# Patient Record
Sex: Female | Born: 1945
Health system: Southern US, Community
[De-identification: ages and names within clinical notes are randomized; demographics above are authoritative.]

## PROBLEM LIST (undated history)

## (undated) DIAGNOSIS — Z86018 Personal history of other benign neoplasm: Secondary | ICD-10-CM

## (undated) DIAGNOSIS — T7840XA Allergy, unspecified, initial encounter: Secondary | ICD-10-CM

## (undated) DIAGNOSIS — F329 Major depressive disorder, single episode, unspecified: Secondary | ICD-10-CM

## (undated) DIAGNOSIS — N183 Chronic kidney disease, stage 3 unspecified: Secondary | ICD-10-CM

## (undated) DIAGNOSIS — M1990 Unspecified osteoarthritis, unspecified site: Secondary | ICD-10-CM

## (undated) DIAGNOSIS — F32A Depression, unspecified: Secondary | ICD-10-CM

## (undated) DIAGNOSIS — H269 Unspecified cataract: Secondary | ICD-10-CM

## (undated) DIAGNOSIS — M159 Polyosteoarthritis, unspecified: Secondary | ICD-10-CM

## (undated) DIAGNOSIS — K219 Gastro-esophageal reflux disease without esophagitis: Secondary | ICD-10-CM

## (undated) DIAGNOSIS — E785 Hyperlipidemia, unspecified: Secondary | ICD-10-CM

## (undated) DIAGNOSIS — G4733 Obstructive sleep apnea (adult) (pediatric): Secondary | ICD-10-CM

## (undated) DIAGNOSIS — M8580 Other specified disorders of bone density and structure, unspecified site: Secondary | ICD-10-CM

## (undated) DIAGNOSIS — J309 Allergic rhinitis, unspecified: Secondary | ICD-10-CM

## (undated) DIAGNOSIS — F419 Anxiety disorder, unspecified: Secondary | ICD-10-CM

## (undated) DIAGNOSIS — K760 Fatty (change of) liver, not elsewhere classified: Secondary | ICD-10-CM

## (undated) DIAGNOSIS — G4730 Sleep apnea, unspecified: Secondary | ICD-10-CM

## (undated) DIAGNOSIS — I10 Essential (primary) hypertension: Secondary | ICD-10-CM

## (undated) HISTORY — DX: Unspecified cataract: H26.9

## (undated) HISTORY — DX: Obstructive sleep apnea (adult) (pediatric): G47.33

## (undated) HISTORY — DX: Other specified disorders of bone density and structure, unspecified site: M85.80

## (undated) HISTORY — PX: TOTAL ABDOMINAL HYSTERECTOMY W/ BILATERAL SALPINGOOPHORECTOMY: SHX83

## (undated) HISTORY — DX: Chronic kidney disease, stage 3 (moderate): N18.3

## (undated) HISTORY — DX: Essential (primary) hypertension: I10

## (undated) HISTORY — DX: Chronic kidney disease, stage 3 unspecified: N18.30

## (undated) HISTORY — DX: Polyosteoarthritis, unspecified: M15.9

## (undated) HISTORY — DX: Unspecified osteoarthritis, unspecified site: M19.90

## (undated) HISTORY — DX: Fatty (change of) liver, not elsewhere classified: K76.0

## (undated) HISTORY — DX: Major depressive disorder, single episode, unspecified: F32.9

## (undated) HISTORY — DX: Sleep apnea, unspecified: G47.30

## (undated) HISTORY — DX: Anxiety disorder, unspecified: F41.9

## (undated) HISTORY — DX: Hyperlipidemia, unspecified: E78.5

## (undated) HISTORY — DX: Allergy, unspecified, initial encounter: T78.40XA

## (undated) HISTORY — DX: Personal history of other benign neoplasm: Z86.018

## (undated) HISTORY — DX: Depression, unspecified: F32.A

## (undated) HISTORY — DX: Allergic rhinitis, unspecified: J30.9

## (undated) HISTORY — DX: Gastro-esophageal reflux disease without esophagitis: K21.9

## (undated) HISTORY — PX: BREAST EXCISIONAL BIOPSY: SUR124

## (undated) HISTORY — PX: COLONOSCOPY: SHX174

---

## 1991-02-10 DIAGNOSIS — Z86018 Personal history of other benign neoplasm: Secondary | ICD-10-CM

## 1991-02-10 HISTORY — PX: ACOUSTIC NEUROMA RESECTION: SHX5713

## 1991-02-10 HISTORY — DX: Personal history of other benign neoplasm: Z86.018

## 1997-06-01 ENCOUNTER — Ambulatory Visit (HOSPITAL_BASED_OUTPATIENT_CLINIC_OR_DEPARTMENT_OTHER): Admission: RE | Admit: 1997-06-01 | Discharge: 1997-06-01 | Payer: Self-pay | Admitting: *Deleted

## 1998-11-16 ENCOUNTER — Emergency Department (HOSPITAL_COMMUNITY): Admission: EM | Admit: 1998-11-16 | Discharge: 1998-11-16 | Payer: Self-pay | Admitting: Emergency Medicine

## 1999-08-29 ENCOUNTER — Encounter: Payer: Self-pay | Admitting: Otolaryngology

## 1999-08-29 ENCOUNTER — Ambulatory Visit (HOSPITAL_COMMUNITY): Admission: RE | Admit: 1999-08-29 | Discharge: 1999-08-29 | Payer: Self-pay | Admitting: Otolaryngology

## 2000-06-16 ENCOUNTER — Encounter: Payer: Self-pay | Admitting: Otolaryngology

## 2000-06-16 ENCOUNTER — Encounter: Admission: RE | Admit: 2000-06-16 | Discharge: 2000-06-16 | Payer: Self-pay | Admitting: Otolaryngology

## 2004-03-12 ENCOUNTER — Ambulatory Visit: Payer: Self-pay | Admitting: Family Medicine

## 2004-05-20 ENCOUNTER — Ambulatory Visit (HOSPITAL_COMMUNITY): Admission: RE | Admit: 2004-05-20 | Discharge: 2004-05-20 | Payer: Self-pay | Admitting: Otolaryngology

## 2005-10-13 ENCOUNTER — Ambulatory Visit: Payer: Self-pay | Admitting: Family Medicine

## 2005-10-20 ENCOUNTER — Ambulatory Visit: Payer: Self-pay | Admitting: Family Medicine

## 2005-12-23 ENCOUNTER — Ambulatory Visit: Payer: Self-pay | Admitting: Family Medicine

## 2005-12-23 LAB — CONVERTED CEMR LAB
AST: 26 units/L (ref 0–37)
Albumin: 4.3 g/dL (ref 3.5–5.2)
Alkaline Phosphatase: 67 units/L (ref 39–117)
Total Bilirubin: 1 mg/dL (ref 0.3–1.2)

## 2006-09-17 DIAGNOSIS — J309 Allergic rhinitis, unspecified: Secondary | ICD-10-CM

## 2006-09-17 DIAGNOSIS — I10 Essential (primary) hypertension: Secondary | ICD-10-CM | POA: Insufficient documentation

## 2006-09-17 DIAGNOSIS — K219 Gastro-esophageal reflux disease without esophagitis: Secondary | ICD-10-CM

## 2006-10-20 ENCOUNTER — Encounter: Payer: Self-pay | Admitting: Family Medicine

## 2007-01-10 ENCOUNTER — Ambulatory Visit: Payer: Self-pay | Admitting: Family Medicine

## 2007-01-10 LAB — CONVERTED CEMR LAB
ALT: 32 units/L (ref 0–35)
Alkaline Phosphatase: 62 units/L (ref 39–117)
Basophils Relative: 0.8 % (ref 0.0–1.0)
Bilirubin, Direct: 0.1 mg/dL (ref 0.0–0.3)
CO2: 28 meq/L (ref 19–32)
Calcium: 9.1 mg/dL (ref 8.4–10.5)
Creatinine, Ser: 0.7 mg/dL (ref 0.4–1.2)
Eosinophils Relative: 2.2 % (ref 0.0–5.0)
GFR calc Af Amer: 110 mL/min
Glucose, Bld: 92 mg/dL (ref 70–99)
Hemoglobin: 14 g/dL (ref 12.0–15.0)
Lymphocytes Relative: 34.2 % (ref 12.0–46.0)
Monocytes Absolute: 0.4 10*3/uL (ref 0.2–0.7)
Neutro Abs: 3.5 10*3/uL (ref 1.4–7.7)
Platelets: 258 10*3/uL (ref 150–400)
RDW: 11.8 % (ref 11.5–14.6)
Total Bilirubin: 0.5 mg/dL (ref 0.3–1.2)
Total CHOL/HDL Ratio: 3.7
Total Protein: 6.7 g/dL (ref 6.0–8.3)
VLDL: 19 mg/dL (ref 0–40)
WBC: 6.3 10*3/uL (ref 4.5–10.5)

## 2007-01-17 ENCOUNTER — Ambulatory Visit: Payer: Self-pay | Admitting: Family Medicine

## 2007-06-08 ENCOUNTER — Ambulatory Visit: Payer: Self-pay | Admitting: Internal Medicine

## 2007-06-08 ENCOUNTER — Encounter: Payer: Self-pay | Admitting: Family Medicine

## 2007-06-28 ENCOUNTER — Telehealth: Payer: Self-pay | Admitting: *Deleted

## 2007-07-18 ENCOUNTER — Telehealth: Payer: Self-pay | Admitting: *Deleted

## 2007-12-06 ENCOUNTER — Encounter: Payer: Self-pay | Admitting: Family Medicine

## 2008-01-11 ENCOUNTER — Ambulatory Visit: Payer: Self-pay | Admitting: Family Medicine

## 2008-01-11 DIAGNOSIS — N9089 Other specified noninflammatory disorders of vulva and perineum: Secondary | ICD-10-CM

## 2008-01-11 LAB — CONVERTED CEMR LAB
ALT: 41 units/L — ABNORMAL HIGH (ref 0–35)
AST: 34 units/L (ref 0–37)
Basophils Absolute: 0 10*3/uL (ref 0.0–0.1)
Basophils Relative: 0.8 % (ref 0.0–3.0)
Bilirubin, Direct: 0.1 mg/dL (ref 0.0–0.3)
Blood in Urine, dipstick: NEGATIVE
CO2: 30 meq/L (ref 19–32)
Calcium: 9.7 mg/dL (ref 8.4–10.5)
Chloride: 106 meq/L (ref 96–112)
Creatinine, Ser: 0.7 mg/dL (ref 0.4–1.2)
Direct LDL: 188.6 mg/dL
Eosinophils Relative: 5.4 % — ABNORMAL HIGH (ref 0.0–5.0)
Glucose, Bld: 94 mg/dL (ref 70–99)
Glucose, Urine, Semiquant: NEGATIVE
HDL: 73 mg/dL (ref >39.0)
Hemoglobin: 13.8 g/dL (ref 12.0–15.0)
Lymphocytes Relative: 32.5 % (ref 12.0–46.0)
MCHC: 34.5 g/dL (ref 30.0–36.0)
Monocytes Relative: 7.2 % (ref 3.0–12.0)
Neutro Abs: 3.3 10*3/uL (ref 1.4–7.7)
Neutrophils Relative %: 54.1 % (ref 43.0–77.0)
Nitrite: NEGATIVE
Protein, U semiquant: NEGATIVE
RBC: 4.34 M/uL (ref 3.87–5.11)
Total Bilirubin: 0.6 mg/dL (ref 0.3–1.2)
Total CHOL/HDL Ratio: 3.7
Total Protein: 7.1 g/dL (ref 6.0–8.3)
Urobilinogen, UA: 0.2
VLDL: 17 mg/dL (ref 0–40)
WBC Urine, dipstick: NEGATIVE
WBC: 5.9 10*3/uL (ref 4.5–10.5)
pH: 6

## 2008-01-26 ENCOUNTER — Ambulatory Visit: Payer: Self-pay | Admitting: Family Medicine

## 2008-03-22 ENCOUNTER — Ambulatory Visit: Payer: Self-pay | Admitting: Family Medicine

## 2008-03-22 LAB — CONVERTED CEMR LAB
Direct LDL: 128 mg/dL
HDL: 62.9 mg/dL (ref >39.0)
VLDL: 21 mg/dL (ref 0–40)

## 2008-03-29 ENCOUNTER — Ambulatory Visit: Payer: Self-pay | Admitting: Family Medicine

## 2008-04-04 LAB — CONVERTED CEMR LAB
Bilirubin, Direct: 0.1 mg/dL (ref 0.0–0.3)
Total Bilirubin: 0.7 mg/dL (ref 0.3–1.2)

## 2008-12-31 ENCOUNTER — Encounter: Admission: RE | Admit: 2008-12-31 | Discharge: 2008-12-31 | Payer: Self-pay | Admitting: Obstetrics and Gynecology

## 2009-03-07 ENCOUNTER — Ambulatory Visit: Payer: Self-pay | Admitting: Family Medicine

## 2009-03-07 LAB — CONVERTED CEMR LAB
ALT: 49 units/L — ABNORMAL HIGH (ref 0–35)
BUN: 16 mg/dL (ref 6–23)
Bilirubin Urine: NEGATIVE
Bilirubin, Direct: 0.1 mg/dL (ref 0.0–0.3)
Blood in Urine, dipstick: NEGATIVE
Calcium: 10 mg/dL (ref 8.4–10.5)
Cholesterol: 210 mg/dL — ABNORMAL HIGH (ref 0–200)
Creatinine, Ser: 1 mg/dL (ref 0.4–1.2)
Direct LDL: 128.2 mg/dL
Eosinophils Relative: 3.9 % (ref 0.0–5.0)
GFR calc non Af Amer: 59.67 mL/min (ref >60)
Glucose, Urine, Semiquant: NEGATIVE
HDL: 61.5 mg/dL (ref >39.00)
Lymphocytes Relative: 30.6 % (ref 12.0–46.0)
MCV: 92.1 fL (ref 78.0–100.0)
Monocytes Absolute: 0.4 10*3/uL (ref 0.1–1.0)
Monocytes Relative: 8.1 % (ref 3.0–12.0)
Neutrophils Relative %: 56.7 % (ref 43.0–77.0)
Platelets: 244 10*3/uL (ref 150.0–400.0)
Total Bilirubin: 0.9 mg/dL (ref 0.3–1.2)
Triglycerides: 152 mg/dL — ABNORMAL HIGH (ref 0.0–149.0)
Urobilinogen, UA: 0.2
VLDL: 30.4 mg/dL (ref 0.0–40.0)
WBC: 5.4 10*3/uL (ref 4.5–10.5)
pH: 5.5

## 2009-03-14 ENCOUNTER — Encounter (INDEPENDENT_AMBULATORY_CARE_PROVIDER_SITE_OTHER): Payer: Self-pay | Admitting: *Deleted

## 2009-03-14 ENCOUNTER — Ambulatory Visit: Payer: Self-pay | Admitting: Family Medicine

## 2009-03-14 DIAGNOSIS — R945 Abnormal results of liver function studies: Secondary | ICD-10-CM | POA: Insufficient documentation

## 2009-03-14 DIAGNOSIS — G4720 Circadian rhythm sleep disorder, unspecified type: Secondary | ICD-10-CM

## 2009-04-05 ENCOUNTER — Ambulatory Visit: Payer: Self-pay | Admitting: Gastroenterology

## 2009-04-05 LAB — CONVERTED CEMR LAB
Ferritin: 250 ng/mL (ref 10.0–291.0)
Hep B S Ab: NEGATIVE
Iron: 66 ug/dL (ref 42–145)
Saturation Ratios: 17.6 % — ABNORMAL LOW (ref 20.0–50.0)

## 2009-04-11 ENCOUNTER — Ambulatory Visit (HOSPITAL_COMMUNITY): Admission: RE | Admit: 2009-04-11 | Discharge: 2009-04-11 | Payer: Self-pay | Admitting: Gastroenterology

## 2009-04-11 ENCOUNTER — Ambulatory Visit: Payer: Self-pay | Admitting: Pulmonary Disease

## 2009-04-11 DIAGNOSIS — G4710 Hypersomnia, unspecified: Secondary | ICD-10-CM

## 2009-04-12 ENCOUNTER — Telehealth: Payer: Self-pay | Admitting: Gastroenterology

## 2009-04-23 LAB — HM PAP SMEAR: HM Pap smear: NORMAL

## 2009-04-24 DIAGNOSIS — G4700 Insomnia, unspecified: Secondary | ICD-10-CM

## 2009-05-10 ENCOUNTER — Encounter: Payer: Self-pay | Admitting: Pulmonary Disease

## 2009-05-10 ENCOUNTER — Ambulatory Visit (HOSPITAL_BASED_OUTPATIENT_CLINIC_OR_DEPARTMENT_OTHER): Admission: RE | Admit: 2009-05-10 | Discharge: 2009-05-10 | Payer: Self-pay | Admitting: Pulmonary Disease

## 2009-05-17 ENCOUNTER — Ambulatory Visit (HOSPITAL_COMMUNITY): Admission: RE | Admit: 2009-05-17 | Discharge: 2009-05-17 | Payer: Self-pay | Admitting: Otolaryngology

## 2009-05-28 ENCOUNTER — Telehealth (INDEPENDENT_AMBULATORY_CARE_PROVIDER_SITE_OTHER): Payer: Self-pay | Admitting: *Deleted

## 2009-06-03 ENCOUNTER — Ambulatory Visit: Payer: Self-pay | Admitting: Pulmonary Disease

## 2009-06-03 ENCOUNTER — Encounter (INDEPENDENT_AMBULATORY_CARE_PROVIDER_SITE_OTHER): Payer: Self-pay | Admitting: *Deleted

## 2009-06-03 DIAGNOSIS — G4733 Obstructive sleep apnea (adult) (pediatric): Secondary | ICD-10-CM | POA: Insufficient documentation

## 2009-06-05 ENCOUNTER — Ambulatory Visit: Payer: Self-pay | Admitting: Gastroenterology

## 2009-06-08 ENCOUNTER — Ambulatory Visit: Payer: Self-pay | Admitting: Pulmonary Disease

## 2009-06-18 ENCOUNTER — Ambulatory Visit: Payer: Self-pay | Admitting: Gastroenterology

## 2009-06-27 ENCOUNTER — Telehealth (INDEPENDENT_AMBULATORY_CARE_PROVIDER_SITE_OTHER): Payer: Self-pay | Admitting: *Deleted

## 2009-07-24 ENCOUNTER — Ambulatory Visit: Payer: Self-pay | Admitting: Pulmonary Disease

## 2009-08-29 ENCOUNTER — Telehealth (INDEPENDENT_AMBULATORY_CARE_PROVIDER_SITE_OTHER): Payer: Self-pay | Admitting: *Deleted

## 2009-08-30 ENCOUNTER — Encounter: Payer: Self-pay | Admitting: Pulmonary Disease

## 2010-02-09 DIAGNOSIS — K760 Fatty (change of) liver, not elsewhere classified: Secondary | ICD-10-CM

## 2010-02-09 DIAGNOSIS — E785 Hyperlipidemia, unspecified: Secondary | ICD-10-CM

## 2010-02-09 HISTORY — DX: Fatty (change of) liver, not elsewhere classified: K76.0

## 2010-02-09 HISTORY — DX: Hyperlipidemia, unspecified: E78.5

## 2010-02-13 ENCOUNTER — Encounter
Admission: RE | Admit: 2010-02-13 | Discharge: 2010-02-13 | Payer: Self-pay | Source: Home / Self Care | Attending: Obstetrics and Gynecology | Admitting: Obstetrics and Gynecology

## 2010-03-04 ENCOUNTER — Ambulatory Visit
Admission: RE | Admit: 2010-03-04 | Discharge: 2010-03-04 | Payer: Self-pay | Source: Home / Self Care | Attending: Family Medicine | Admitting: Family Medicine

## 2010-03-04 ENCOUNTER — Encounter: Payer: Self-pay | Admitting: Family Medicine

## 2010-03-11 NOTE — Assessment & Plan Note (Signed)
Summary: ABNORMAL LIVER FUNCTIONS/YF   History of Present Illness Visit Type: Initial Consult Primary GI MD: Melvia Heaps MD [facility] Primary Provider: Kelle Darting, MD Requesting Provider: Kelle Darting, MD Chief Complaint: Patient referred for her abnormal LFTS and her sister was recently dxed with liver disease. She denies any current GI compaints.  History of Present Illness:   Joan Mahoney is a pleasant 65 year old white female referred at the request of Dr. Tawanna Cooler for evaluation of abnormal liver tests.  These were noted on incidental testing.  She has no complaints including pain, pruritus or jaundice.  There is no history of IV drug use, blood transfusions, hepatitis or jaundice.  Family history is pertinent for a sister who has nonalcoholic cirrhosis and a uncle who had nonalcoholic cirrhosis.  Blood work in January, 2011 was pertinent for an AST of 38 and ALT of 49; other liver tests were normal.   GI Review of Systems      Denies abdominal pain, acid reflux, belching, bloating, chest pain, dysphagia with liquids, dysphagia with solids, heartburn, loss of appetite, nausea, vomiting, vomiting blood, weight loss, and  weight gain.        Denies anal fissure, black tarry stools, change in bowel habit, constipation, diarrhea, diverticulosis, fecal incontinence, heme positive stool, hemorrhoids, irritable bowel syndrome, jaundice, light color stool, liver problems, rectal bleeding, and  rectal pain. Preventive Screening-Counseling & Management      Drug Use:  no.      Current Medications (verified): 1)  Prilosec Otc 20 Mg  Tbec (Omeprazole Magnesium) .... Take 1 Tablet By Mouth Once A Day 2)  Aspirin 81 Mg  Tabs (Aspirin) .... Sometimes 3)  Dyazide 37.5-25 Mg Caps (Triamterene-Hctz) .... Take 1 Tablet By Mouth Every Morning 4)  Alprazolam 0.25 Mg Tabs (Alprazolam) .... Take One By Mouth As Needed  Allergies (verified): No Known Drug Allergies  Past History:  Past Medical  History: Last updated: 01/26/2008 Allergic rhinitis GERD Insomnia Deaf left Ear secondary acoustic neuroma Hypertension TAH/BSO.  No cancer BTL childbirth x 2 foot surgery outpatient Hyperlipidemia  Past Surgical History: Last updated: 09/17/2006 TAh/BSO BTL Foot sx Acustic Neuroma-Left  Family History: Family History Diabetes 1st degree relative: sister, father, mother Family History Hypertension:father Fam hx Non Hodgkin Lyphoma:mother Family History of Liver Disease/Cirrhosis: sister, maternal uncle both nonalcohol Family History of Colon Polyps:father No FH of Colon Cancer:  Social History: Former Smoker Married with two sons Alcohol Use - no Daily Caffeine Use 2 per day Illicit Drug Use - no Drug Use:  no  Review of Systems  The patient denies allergy/sinus, anemia, anxiety-new, arthritis/joint pain, back pain, blood in urine, breast changes/lumps, change in vision, confusion, cough, coughing up blood, depression-new, fainting, fatigue, fever, headaches-new, hearing problems, heart murmur, heart rhythm changes, itching, menstrual pain, muscle pains/cramps, night sweats, nosebleeds, pregnancy symptoms, shortness of breath, skin rash, sleeping problems, sore throat, swelling of feet/legs, swollen lymph glands, thirst - excessive , urination - excessive , urination changes/pain, urine leakage, vision changes, and voice change.    Vital Signs:  Patient profile:   65 year old female Height:      62 inches Weight:      170.2 pounds BMI:     31.24 Pulse rate:   84 / minute Pulse rhythm:   regular BP sitting:   138 / 78  (left arm) Cuff size:   regular  Vitals Entered By: Harlow Mares CMA Duncan Dull) (April 05, 2009 4:14 PM)  Physical Exam  Additional Exam:  On physical exam she is a well-developed large female  skin: anicteric HEENT: normocephalic; PEERLA; no nasal or pharyngeal abnormalities neck: supple nodes: no cervical lymphadenopathy chest: clear to  ausculatation and percussion heart: no murmurs, gallops, or rubs abd: soft, nontender; BS normoactive; no abdominal masses, tenderness, organomegaly rectal: deferred ext: no cynanosis, clubbing, edema skeletal: no deformities neuro: oriented x 3; no focal abnormalities    Impression & Recommendations:  Problem # 1:  NONSPECIFIC ABNORMAL RESULTS LIVR FUNCTION STUDY (ICD-794.8) Patient's  LFT abnormalities are probably due to hepatic steatosis.  Underlying liver disease including congenital liver disease is unlikely.  Recommendations #1 abdominal ultrasound #2 check serologies for hepatitis B and C., iron studies, ANA and AMA Orders: T-ANA ([id number]) T-AMA ([id number]) T-Hepatitis B Surface Antibody (30160-10932) T-Hepatitis B Surface Antigen (35573-22025) T-Hepatitis C Anti HCV (42706) Ultrasound Abdomen (UAS) TLB-IBC Pnl (Iron/FE;Transferrin) (83550-IBC) TLB-Iron, (Fe) Total (83540-FE) TLB-Ferritin (82728-FER) TLB-PTT (85730-PTTL)  Patient Instructions: 1)  Please schedule a follow-up appointment in 3 weeks.  2)  The medication list was reviewed and reconciled.  All changed / newly prescribed medications were explained.  A complete medication list was provided to the patient / caregiver.

## 2010-03-11 NOTE — Progress Notes (Signed)
Summary: U/S results and ? colonoscopy  Phone Note Outgoing Call Call back at [facility] Phone [phone]   Call placed by: Merri Ray CMA Duncan Dull),  April 12, 2009 8:11 AM Summary of Call: Called pt to inform that her test results from her U/S are negative. L/M for pt to return call. Initial call taken by: Merri Ray CMA Duncan Dull),  April 12, 2009 8:12 AM  Follow-up for Phone Call        Pt returned call, explained to her that her U/S results are normal. She hasnt had a colonoscopy since 2000, the records are not available, Wonders if she needs to schedule.    Dr Arlyce Dice Please advise Follow-up by: Merri Ray CMA Duncan Dull),  April 12, 2009 8:41 AM  Additional Follow-up for Phone Call Additional follow up Details #1::        yes for colo Additional Follow-up by: Louis Meckel MD,  April 12, 2009 10:40 AM    Additional Follow-up for Phone Call Additional follow up Details #2::    Called pt, L/M for pt to return call Follow-up by: Merri Ray CMA Duncan Dull),  April 12, 2009 10:53 AM  Additional Follow-up for Phone Call Additional follow up Details #3:: Details for Additional Follow-up Action Taken: Called pt again. L/M for pt that she could contact the office and schedule her colonoscopy and previsit. Told pt to call back as needed  Additional Follow-up by: Merri Ray CMA Duncan Dull),  April 12, 2009 3:02 PM

## 2010-03-11 NOTE — Letter (Signed)
Summary: New Patient letter  [facility]  [address] [place], [place] [address]   Phone: [phone]  Fax: [phone]       03/14/2009 MRN: [id number]  Joan Mahoney [address] [place], [place]  [address]  Dear Ms. Iglesias,  Welcome to the Gastroenterology Division at [facility].    You are scheduled to see Dr.  Arlyce Dice on 04-05-09 at 3:45pm on the 3rd floor at [facility], [address]  We ask that you try to arrive at our office 15 minutes prior to your appointment time to allow for check-in.  We would like you to complete the enclosed self-administered evaluation form prior to your visit and bring it with you on the day of your appointment.  We will review it with you.  Also, please bring a complete list of all your medications or, if you prefer, bring the medication bottles and we will list them.  Please bring your insurance card so that we may make a copy of it.  If your insurance requires a referral to see a specialist, please bring your referral form from your primary care physician.  Co-payments are due at the time of your visit and may be paid by cash, check or credit card.     Your office visit will consist of a consult with your physician (includes a physical exam), any laboratory testing he/she may order, scheduling of any necessary diagnostic testing (e.g. x-ray, ultrasound, CT-scan), and scheduling of a procedure (e.g. Endoscopy, Colonoscopy) if required.  Please allow enough time on your schedule to allow for any/all of these possibilities.    If you cannot keep your appointment, please call [phone] to cancel or reschedule prior to your appointment date.  This allows [place] the opportunity to schedule an appointment for another patient in need of care.  If you do not cancel or reschedule by 5 p.m. the business day prior to your appointment date, you will be charged a $50.00 late cancellation/no-show fee.    Thank you for choosing [facility] for your medical needs.  We appreciate the opportunity to care for you.  Please visit [place] at our website  to learn more about our practice.                     Sincerely,                                                             The Gastroenterology Division

## 2010-03-11 NOTE — Progress Notes (Signed)
Summary: sinus infection  Phone Note Call from Patient Call back at [phone]   Caller: Patient Call For: clance Summary of Call: pt think c pap machine is causing sinus infection. she is going out of town and would like to know if she can d/c use of it until she return from trip. Initial call taken by: Rickard Patience,  Jun 27, 2009 9:28 AM  Follow-up for Phone Call        The patient c/o sinus pain and pressure for several days Runny nose with yellow drainage and a sore throat. She believes the cpap is contributing to this ptoblem. She is doing daily nasal irrigation. She is flying OOT on Sunday and would like to know if she can stop using the CPAP until she gets back in 1 week. Please advise on sinus pressure and cpap use.  Follow-up by: Michel Bickers CMA,  Jun 27, 2009 9:55 AM  Additional Follow-up for Phone Call Additional follow up Details #1::        typically, cpap does not cause sinus issues if they are kept clean, and if the humidity is kept at an adequate level.  I would wonder if she needs to turn the heat up to get more humidity.  It is ok for her to vacation without her cpap, understanding that her sleep may not be very good.  Can use sinus rinses and tylenol cold and sinus for 5 days or less to see if  improves. Additional Follow-up by: Barbaraann Share MD,  Jun 27, 2009 4:22 PM    Additional Follow-up for Phone Call Additional follow up Details #2::    LMTCB at home # and work number. Carron Curie CMA  Jun 27, 2009 4:25 PM  The pt is aware to use sinus rinses and tylenol cold and sinus for 5 days or less to see if this helps with the sinus pressure. She is also aware she can travel without the cpap but will need to start using again as soon as she returns home and may want to try turning up the humidity to see if this also helps. She will call if she continues to have any problems.  Follow-up by: Michel Bickers CMA,  Jun 27, 2009 4:32 PM

## 2010-03-11 NOTE — Miscellaneous (Signed)
  Clinical Lists Changes  Orders: Added new Referral order of DME Referral (DME) - Signed

## 2010-03-11 NOTE — Assessment & Plan Note (Signed)
Summary: consult for hypersomnia, insomnia   Copy to:  Kelle Darting, MD Primary Provider/Referring Provider:  Kelle Darting, MD  CC:  Sleep Consult.  History of Present Illness: The pt is a 65y/o female who I have been asked to see for possible osa.  She has had sleeping issues for many years, and has also been noted to have loud snoring but no observed pauses in breathing.  She typically goes to bed btw 10-11:30pm, and awakens at 6am to start her day.  She never feels rested upon arising.  She will get to sleep in less than 4/7 nights, but may take an hour the other 3.  She states that her mind is "racing".  She typically will stay in bed and toss and turn.  Part of the issue is that her spouse snores.  She denies any significant RLS symptoms in the evening, and her husband has not commented on kicking during the night.  She does have some chronic pain, but does not believe this hinders her sleep.  She does note some sleep pressure at work which is especially an issue in the afternoons.  She will typically drink 2 cups of coffee in the am, and one around 3pm.  She does note sleep pressure watching tv in the evening, and will take catnaps on occasion in the early evening during the week.  Her weight is neutral over the past 2 years, and her epworth score today is 14.  Medications Prior to Update: 1)  Prilosec Otc 20 Mg  Tbec (Omeprazole Magnesium) .... Take 1 Tablet By Mouth Once A Day 2)  Aspirin 81 Mg  Tabs (Aspirin) .... Sometimes 3)  Dyazide 37.5-25 Mg Caps (Triamterene-Hctz) .... Take 1 Tablet By Mouth Every Morning 4)  Alprazolam 0.25 Mg Tabs (Alprazolam) .... Take One By Mouth As Needed  Current Medications (verified): 1)  Prilosec Otc 20 Mg  Tbec (Omeprazole Magnesium) .... Take 1 Tablet By Mouth Once A Day 2)  Aspirin 81 Mg  Tabs (Aspirin) .... Sometimes 3)  Dyazide 37.5-25 Mg Caps (Triamterene-Hctz) .... Take 1 Tablet By Mouth Every Morning 4)  Alprazolam 0.25 Mg Tabs (Alprazolam)  .... Take One By Mouth As Needed 5)  Allergy Injections .... Take As Directed  Allergies (verified): No Known Drug Allergies  Past History:  Past Medical History: Reviewed history from 01/26/2008 and no changes required. Allergic rhinitis GERD Insomnia Deaf left Ear secondary acoustic neuroma Hypertension TAH/BSO.  No cancer BTL childbirth x 2 foot surgery outpatient Hyperlipidemia  Past Surgical History: TAh/BSO  1997 BTL 1989 Foot sx Acustic Neuroma-Left breast biopsy 1985  Family History: Reviewed history from 04/05/2009 and no changes required. Family History Diabetes 1st degree relative: sister, father, mother Family History Hypertension:father Fam hx Non Hodgkin Lyphoma:mother Family History of Liver Disease/Cirrhosis: sister, maternal uncle both nonalcohol Family History of Colon Polyps:father No FH of Colon Cancer: allergies: father, sons  Social History: Reviewed history from 04/05/2009 and no changes required. Former Smoker.  started as a "teenager"  smoked "socially"  Quit 2010. Married with two sons pt works as a Counsellor Alcohol Use - no Daily Caffeine Use 2 per day Illicit Drug Use - no  Review of Systems       The patient complains of acid heartburn, indigestion, difficulty swallowing, sore throat, headaches, nasal congestion/difficulty breathing through nose, sneezing, ear ache, depression, and hand/feet swelling.  The patient denies shortness of breath with activity, shortness of breath at rest, productive cough, non-productive cough, coughing  up blood, chest pain, irregular heartbeats, loss of appetite, weight change, abdominal pain, tooth/dental problems, itching, anxiety, joint stiffness or pain, rash, change in color of mucus, and fever.    Vital Signs:  Patient profile:   65 year old female Height:      62 inches Weight:      173 pounds BMI:     31.76 O2 Sat:      97 % on Room air Temp:     98.4 degrees F oral Pulse rate:   100 /  minute BP sitting:   142 / 94  (left arm) Cuff size:   regular  Vitals Entered By: Arman Filter LPN (April 11, 1608 10:59 AM)  O2 Flow:  Room air CC: Sleep Consult Comments Medications reviewed with patient Arman Filter LPN  April 12, 9602 10:59 AM    Physical Exam  General:  ow female in nad Eyes:  PERRLA and EOMI.   Nose:  mildly deviated septum Mouth:  mild elongation of soft palate and uvula Neck:  large size, no jvd, tmg, LN Lungs:  clear to auscultation Heart:  rrr, no mrg Abdomen:  soft and nontender, bs+ Extremities:  no edema or cyanosis pulses intact distally Neurologic:  alert and oriented, moves all 4.   Impression & Recommendations:  Problem # 1:  HYPERSOMNIA (ICD-780.54) the pt clearly has daytime hypersomnia that can interfere with her QOL.  It is unclear if this is related to environmental issues, her insomnia, or possibly due to sleep apnea.  She is overweight and has loud snoring, and describes nonrestorative sleep even when she falls asleep quickly 4 nights out of 7.  She has no hx to support RLS.  At this point, would like to schedule for sleep study to evaluate for sleep apnea or any other disrupters of her sleep.   Problem # 2:  PERSISTENT DISORDER INITIATING/MAINTAINING SLEEP (ICD-307.42) the pt has some degree of psychophysiologic insomnia that hopefully can be resolved with behavioral techniques and improved sleep hygiene.  I have reviewed stimulus control therapy with her, as well as what constitutes good sleep hygiene.  Medications Added to Medication List This Visit: 1)  Allergy Injections  .... Take as directed  Other Orders: Consultation Level IV (54098) Sleep Disorder Referral (Sleep Disorder)  Patient Instructions: 1)  do not catnap during day or in evening. 2)  stop caffeine intake after 10am until we can sort out your sleep issues. 3)  do not stay in bed more than if you cannot initiate sleep...leave bedroom and watch tv or read  until you can re-initiate sleep.  Do this as many times as needed until you fall asleep and stay asleep 4)  will schedule for sleep study.

## 2010-03-11 NOTE — Assessment & Plan Note (Signed)
Summary: rov to discuss sleep study results.   Copy to:  Kelle Darting, MD Primary Provider/Referring Provider:  Kelle Darting, MD  CC:  Pt is here for a f/u appt to discuss sleep study results.  .  History of Present Illness: the pt comes in today for f/u of her recent sleep study.  She was found to have moderate osa with AHI of 22/hr, and desat to 83%.  I have reviewed the results with her in detail, and have answered all of her questions.  Current Medications (verified): 1)  Prilosec Otc 20 Mg  Tbec (Omeprazole Magnesium) .... Take 1 Tablet By Mouth Once A Day As Needed 2)  Aspirin 81 Mg  Tabs (Aspirin) .... Take 1 Tablet By Mouth Once A Day 3)  Dyazide 37.5-25 Mg Caps (Triamterene-Hctz) .... Take 1 Tablet By Mouth Every Morning 4)  Alprazolam 0.25 Mg Tabs (Alprazolam) .... Take One By Mouth As Needed 5)  Allergy Injections .... Take As Directed 6)  Allegra ?mg .... Take 1 Tablet By Mouth Once A Day  Allergies (verified): No Known Drug Allergies  Vital Signs:  Patient profile:   65 year old female Height:      62 inches Weight:      171.25 pounds O2 Sat:      94 % on Room air Temp:     98.3 degrees F oral Pulse rate:   82 / minute BP sitting:   118 / 80  (left arm) Cuff size:   regular  Vitals Entered By: Arman Filter LPN (June 03, 2009 2:34 PM)  O2 Flow:  Room air CC: Pt is here for a f/u appt to discuss sleep study results.   Comments Medications reviewed with patient Arman Filter LPN  June 03, 2009 2:34 PM    Physical Exam  General:  ow female in nad   Impression & Recommendations:  Problem # 1:  OBSTRUCTIVE SLEEP APNEA (ICD-327.23) the pt was found to have moderate osa by her sleep study.  I have discussed the various treatment options with her, including weight loss, surgery, dental appliance, and cpap.  After a long discussion, she decided to try cpap while working on weight loss.  I support this decision.  Time spent with pt today was .  Problem #  2:  PERSISTENT DISORDER INITIATING/MAINTAINING SLEEP (ICD-307.42) the pt has been doing better with sleep onset by sticking to sleep hygiene regimen and with the various behavioral techniques we previously discussed.  Medications Added to Medication List This Visit: 1)  Prilosec Otc 20 Mg Tbec (Omeprazole magnesium) .... Take 1 tablet by mouth once a day as needed 2)  Aspirin 81 Mg Tabs (Aspirin) .... Take 1 tablet by mouth once a day 3)  Allegra ?mg  .... Take 1 tablet by mouth once a day  Other Orders: Est. Patient Level III (65784) DME Referral (DME)  Patient Instructions: 1)  will start on cpap as a trial..please call if tolerance issues 2)  work on weight loss 3)  followup with me in 5 weeks.

## 2010-03-11 NOTE — Letter (Signed)
Summary: [facility] Instructions  [facility]  [address] [place], [place] [address]   Phone: [phone]  Fax: [phone]       Joan Mahoney    1945-11-15    MRN: [id number]        Procedure Day /Date: Tuesday 06/18/09     Arrival Time: 7:30 a.m.     Procedure Time: 8:00 a.m.     Location of Procedure:                    _x_  [facility] (4th Floor)                        PREPARATION FOR COLONOSCOPY WITH MOVIPREP   Starting 5 days prior to your procedure  06/13/09 do not eat nuts, seeds, popcorn, corn, beans, peas,  salads, or any raw vegetables.  Do not take any fiber supplements (e.g. Metamucil, Citrucel, and Benefiber).  THE DAY BEFORE YOUR PROCEDURE         DATE:  06/17/09   DAY:  Monday  1.  Drink clear liquids the entire day-NO SOLID FOOD  2.  Do not drink anything colored red or purple.  Avoid juices with pulp.  No orange juice.  3.  Drink at least 64 oz. (8 glasses) of fluid/clear liquids during the day to prevent dehydration and help the prep work efficiently.  CLEAR LIQUIDS INCLUDE: Water Jello Ice Popsicles Tea (sugar ok, no milk/cream) Powdered fruit flavored drinks Coffee (sugar ok, no milk/cream) Gatorade Juice: apple, white grape, white cranberry  Lemonade Clear bullion, consomm, broth Carbonated beverages (any kind) Strained chicken noodle soup Hard Candy                             4.  In the morning, mix first dose of MoviPrep solution:    Empty 1 Pouch A and 1 Pouch B into the disposable container    Add lukewarm drinking water to the top line of the container. Mix to dissolve    Refrigerate (mixed solution should be used within 24 hrs)  5.  Begin drinking the prep at 5:00 p.m. The MoviPrep container is divided by 4 marks.   Every 15 minutes drink the solution down to the next mark (approximately 8 oz) until the full liter is complete.   6.  Follow completed prep with 16 oz of clear liquid of your choice  (Nothing red or purple).  Continue to drink clear liquids until bedtime.  7.  Before going to bed, mix second dose of MoviPrep solution:    Empty 1 Pouch A and 1 Pouch B into the disposable container    Add lukewarm drinking water to the top line of the container. Mix to dissolve    Refrigerate  THE DAY OF YOUR PROCEDURE      DATE:  06/18/09   DAY:  Tuesday  Beginning at  3:00 a.m. (5 hours before procedure):         1. Every 15 minutes, drink the solution down to the next mark (approx 8 oz) until the full liter is complete.  2. Follow completed prep with 16 oz. of clear liquid of your choice.    3. You may drink clear liquids until  6:00 a.m.  (64 HOURS BEFORE PROCEDURE).   MEDICATION INSTRUCTIONS  Unless otherwise instructed, you should take regular prescription medications with a small sip of  water   as early as possible the morning of your procedure.   Additional medication instructions: Hold dyazide the morning of procedure.         OTHER INSTRUCTIONS  You will need a responsible adult at least 64 years of age to accompany you and drive you home.   This person must remain in the waiting room during your procedure.  Wear loose fitting clothing that is easily removed.  Leave jewelry and other valuables at home.  However, you may wish to bring a book to read or  an iPod/MP3 player to listen to music as you wait for your procedure to start.  Remove all body piercing jewelry and leave at home.  Total time from sign-in until discharge is approximately 2-3 hours.  You should go home directly after your procedure and rest.  You can resume normal activities the  day after your procedure.  The day of your procedure you should not:   Drive   Make legal decisions   Operate machinery   Drink alcohol   Return to work  You will receive specific instructions about eating, activities and medications before you leave.    The above instructions have been reviewed and  explained to me by   Wyona Almas RN  June 05, 2009 11:28 AM     I fully understand and can verbalize these instructions _____________________________ Date _________

## 2010-03-11 NOTE — Procedures (Signed)
Summary: Colonoscopy  Patient: Joan Mahoney Note: All result statuses are Final unless otherwise noted.  Tests: (1) Colonoscopy (COL)   COL Colonoscopy           DONE     [facility]     [address]     [place], [place]  [address]           COLONOSCOPY PROCEDURE REPORT           PATIENT:  Joan Mahoney  MR#:  [id number]     BIRTHDATE:  Aug 18, 1945, 63 yrs. old  GENDER:  female           ENDOSCOPIST:  Barbette Hair. Arlyce Dice, MD     Referred by:           PROCEDURE DATE:  06/18/2009     PROCEDURE:  Diagnostic Colonoscopy     ASA CLASS:  Class II     INDICATIONS:  1) Routine Risk Screening           MEDICATIONS:   Fentanyl 75 mcg IV, Versed 10 mg IV           DESCRIPTION OF PROCEDURE:   After the risks benefits and     alternatives of the procedure were thoroughly explained, informed     consent was obtained.  Digital rectal exam was performed and     revealed no abnormalities.   The LB CF-[id number] endoscope     was introduced through the anus and advanced to the cecum, which     was identified by both the appendix and ileocecal valve, without     limitations.  The quality of the prep was excellent, using     MoviPrep.  The instrument was then slowly withdrawn as the colon     was fully examined.     <<PROCEDUREIMAGES>>           FINDINGS:  Mild diverticulosis was found in the sigmoid colon (see     image11).  This was otherwise a normal examination of the colon     (see image2, image3, image4, image5, image7, image8, image13, and     image14).   Retroflexed views in the rectum revealed no     abnormalities.    The time to cecum =  4.25  minutes. The scope     was then withdrawn (time =  6.25  min) from the patient and the     procedure completed.           COMPLICATIONS:  None           ENDOSCOPIC IMPRESSION:     1) Mild diverticulosis in the sigmoid colon     2) Otherwise normal examination     RECOMMENDATIONS:     1) Continue current colorectal  screening recommendations for     "routine risk" patients with a repeat colonoscopy in 10 years.           REPEAT EXAM:  In 10 year(s) for Colonoscopy.           ______________________________     Barbette Hair. Arlyce Dice, MD           CC: Roderick Pee, MD           n.     [name] at 06/18/2009 08:28 AM           [name], [name], [id number]  Note: An exclamation mark (!) indicates a result that was not  dispersed into the flowsheet. Document Creation Date: 06/18/2009 8:29 AM _______________________________________________________________________  (1) Order result status: Final Collection or observation date-time: 06/18/2009 08:24 Requested date-time:  Receipt date-time:  Reported date-time:  Referring Physician:   Ordering Physician: Melvia Heaps [phone]) Specimen Source:  Source: Launa Grill Order Number: [id number] Lab site:   Appended Document: Colonoscopy    Clinical Lists Changes  Observations: Added new observation of COLONNXTDUE: 06/2019 (06/18/2009 10:56)

## 2010-03-11 NOTE — Miscellaneous (Signed)
Summary: [facility] Previsit/prep  Clinical Lists Changes  Medications: Added new medication of MOVIPREP 100 GM  SOLR (PEG-KCL-NACL-NASULF-NA ASC-C) As per prep instructions. - Signed Rx of MOVIPREP 100 GM  SOLR (PEG-KCL-NACL-NASULF-NA ASC-C) As per prep instructions.;  #1 x 0;  Signed;  Entered by: Wyona Almas RN;  Authorized by: Louis Meckel MD;  Method used: Electronically to [facility] [address], [place], [place], [place]  [address], Ph: [phone], Fax: [phone] Observations: Added new observation of ALLERGY REV: Done (06/05/2009 10:47) Added new observation of NKA: T (06/05/2009 10:47)    Prescriptions: MOVIPREP 100 GM  SOLR (PEG-KCL-NACL-NASULF-NA ASC-C) As per prep instructions.  #1 x 0   Entered by:   Wyona Almas RN   Authorized by:   Louis Meckel MD   Signed by:   Wyona Almas RN on 06/05/2009   Method used:   Electronically to        [facility]  [address] (retail)       [address]       [place], [place]  [address]       Ph: [phone]       Fax: [phone]   RxID:   [id number]

## 2010-03-11 NOTE — Assessment & Plan Note (Signed)
Summary: rov for osa   Copy to:  Kelle Darting, MD Primary Provider/Referring Provider:  Kelle Darting, MD  CC:  Pt is here for a routine f/u appt on her OSA.   Pt states she is using her cpap machine every night except when she went on a recent vacaation and also having recent head congestion.  Pt states when she wears cpap machine she wears it approx 6 hours per night.  Pt c/o headaches and dry mouth.  Pt states she hasn't noticed change in sleep since being on cpap. Marland Kitchen  History of Present Illness: the pt comes in today for f/u of her osa.  She was started on cpap at the last visit, and has been compliant except for a few days while on vacation.  She is wearing 6hrs a night, and has not seen a big difference as of yet in her symptoms.  I have reminded her that we have yet to optimize her pressure.  She has no issues with her mask, but is having some headaches and dry mouth.  She is not adjusting her humidity level.  At least she has not encountered any problems that keep her from wearing.  Medications Prior to Update: 1)  Prilosec Otc 20 Mg  Tbec (Omeprazole Magnesium) .... Take 1 Tablet By Mouth Once A Day As Needed 2)  Aspirin 81 Mg  Tabs (Aspirin) .... Take 1 Tablet By Mouth Once A Day 3)  Dyazide 37.5-25 Mg Caps (Triamterene-Hctz) .... Take 1 Tablet By Mouth Every Morning 4)  Alprazolam 0.25 Mg Tabs (Alprazolam) .... Take One By Mouth As Needed 5)  Allergy Injections .... Take As Directed 6)  Allegra ?mg .... Take 1 Tablet By Mouth Once A Day  Allergies (verified): No Known Drug Allergies  Past History:  Past Medical History: Last updated: 01/26/2008 Allergic rhinitis GERD Insomnia Deaf left Ear secondary acoustic neuroma Hypertension TAH/BSO.  No cancer BTL childbirth x 2 foot surgery outpatient Hyperlipidemia  Past Surgical History: Last updated: 04/11/2009 TAh/BSO  1997 BTL 1989 Foot sx Acustic Neuroma-Left breast biopsy 1985  Family History: Last updated:  04/11/2009 Family History Diabetes 1st degree relative: sister, father, mother Family History Hypertension:father Fam hx Non Hodgkin Lyphoma:mother Family History of Liver Disease/Cirrhosis: sister, maternal uncle both nonalcohol Family History of Colon Polyps:father No FH of Colon Cancer: allergies: father, sons  Social History: Last updated: 04/11/2009 Former Smoker.  started as a "teenager"  smoked "socially"  Quit 2010. Married with two sons pt works as a Counsellor Alcohol Use - no Daily Caffeine Use 2 per day Illicit Drug Use - no  Review of Systems       The patient complains of indigestion, headaches, and nasal congestion/difficulty breathing through nose.  The patient denies shortness of breath with activity, shortness of breath at rest, productive cough, non-productive cough, coughing up blood, chest pain, irregular heartbeats, acid heartburn, loss of appetite, weight change, abdominal pain, difficulty swallowing, sore throat, tooth/dental problems, sneezing, itching, ear ache, anxiety, depression, hand/feet swelling, joint stiffness or pain, rash, change in color of mucus, and fever.    Vital Signs:  Patient profile:   65 year old female Height:      62 inches Weight:      170 pounds BMI:     31.21 O2 Sat:      96 % on Room air Temp:     98.1 degrees F oral Pulse rate:   83 / minute BP sitting:   138 / 82  (  left arm) Cuff size:   regular  Vitals Entered By: Arman Filter LPN (July 24, 2009 11:50 AM)  O2 Flow:  Room air CC: Pt is here for a routine f/u appt on her OSA.   Pt states she is using her cpap machine every night except when she went on a recent vacaation and also having recent head congestion.  Pt states when she wears cpap machine she wears it approx 6 hours per night.  Pt c/o headaches and dry mouth.  Pt states she hasn't noticed change in sleep since being on cpap.  Comments Medications reviewed with patient Arman Filter LPN  July 24, 2009 11:50 AM     Physical Exam  General:  ow female in nad Nose:  no skin breakdown or pressure necrosis from cpap mask.  No nasal obstruction, but some turbinate hypertrophy. Mouth:  clear Extremities:  no significant edema, no cyanosis Neurologic:  alert, not sleepy, moves all 4.   Impression & Recommendations:  Problem # 1:  OBSTRUCTIVE SLEEP APNEA (ICD-327.23) the pt is adapting to cpap, and I will work with her on the various issues.  I have discussed adjustments to her heated humidifier to help with dryness and congestion, and also asked her to not pull mask to tight (it can cause headaches).   I have reminded her to be patient as we adjust her pressure for her, and she should see a difference in her symptoms once they are done.  I have also asked her to work on weight loss. Care Plan:  At this point, will arrange for the patient's machine to be changed over to auto mode for 2 weeks to optimize their pressure.  I will review the downloaded data once sent by dme, and also evaluate for compliance, leaks, and residual osa.  I will call the patient and dme to discuss the results, and have the patient's machine set appropriately.  This will serve as the pt's cpap pressure titration.  Other Orders: Est. Patient Level IV (11914) DME Referral (DME)  Patient Instructions: 1)  will get your machine switched over to automatic mode for 2 weeks to optimize your pressure.  I will let you know the results. 2)  can try nasal pillows if having issues with your current mask 3)  work on weight loss 4)  please give me feedback AFTER we have set your machine to the optimal pressure, to see if it is making a difference.

## 2010-03-11 NOTE — Assessment & Plan Note (Signed)
Summary: cpx//ccm   Vital Signs:  Patient profile:   65 year old female Height:      62 inches Weight:      168 pounds BMI:     30.84 Temp:     99.1 degrees F oral BP sitting:   132 / 86  (left arm) Cuff size:   regular  Vitals Entered By: Kern Reap CMA Duncan Dull) (March 14, 2009 10:47 AM)  History of Present Illness: Joan Mahoney is a 65 year old, married female, nonsmoker comes in today for evaluation of multiple issues.  She has a history of reflux esophagitis.  She takes Prilosec p.r.n.  She has history of hyperlipidemia, for which she takes Zocor 20 mg nightly lipids are ago.  She also has mild hypertension for which she takes Dyazide one daily BP 132 of 86.  She states that her sister was recently diagnosed with chronic liver disease.  She is concerned that i.  I asked her to go with her sister to the next GI appointment.  They say the etiology is diabetes.  She gets routine eye care.  Dental care does BSE monthly.  Gets annual mammography screening colonoscopy 8 years ago, normal.  Tetanus 2002.  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  A new problem, and sleep dysfunction.  She has trouble going to sleep and, then she'll wake up and can't go back to sleep.  She also snores a lot.  She has daytime fatigue.  Question sleep apnea refer to Dr. Mellody Dance for further follow-up.  Allergies (verified): No Known Drug Allergies  Past History:  Past medical, surgical, family and social histories (including risk factors) reviewed, and no changes noted (except as noted below).  Past Medical History: Reviewed history from 01/26/2008 and no changes required. Allergic rhinitis GERD Insomnia Deaf left Ear secondary acoustic neuroma Hypertension TAH/BSO.  No cancer BTL childbirth x 2 foot surgery outpatient Hyperlipidemia  Past Surgical History: Reviewed history from 09/17/2006 and no changes required. TAh/BSO BTL Foot sx Acustic  Neuroma-Left  Family History: Reviewed history from 09/17/2006 and no changes required. Family History Diabetes 1st degree relative Family History Hypertension Fam hx Non Hodgkin Lyphoma  Social History: Reviewed history from 09/17/2006 and no changes required. Former Smoker Alcohol use-yes Married  Review of Systems      See HPI  Physical Exam  General:  Well-developed,well-nourished,in no acute distress; alert,appropriate and cooperative throughout examination Head:  Normocephalic and atraumatic without obvious abnormalities. No apparent alopecia or balding. Eyes:  No corneal or conjunctival inflammation noted. EOMI. Perrla. Funduscopic exam benign, without hemorrhages, exudates or papilledema. Vision grossly normal. Ears:  External ear exam shows no significant lesions or deformities.  Otoscopic examination reveals clear canals, tympanic membranes are intact bilaterally without bulging, retraction, inflammation or discharge. Hearing is grossly normal bilaterally. Nose:  External nasal examination shows no deformity or inflammation. Nasal mucosa are pink and moist without lesions or exudates. Mouth:  Oral mucosa and oropharynx without lesions or exudates.  Teeth in good repair. Neck:  No deformities, masses, or tenderness noted. Chest Wall:  No deformities, masses, or tenderness noted. Breasts:  No mass, nodules, thickening, tenderness, bulging, retraction, inflamation, nipple discharge or skin changes noted.   Lungs:  Normal respiratory effort, chest expands symmetrically. Lungs are clear to auscultation, no crackles or wheezes. Heart:  Normal rate and regular rhythm. S1 and S2 normal without gallop, murmur, click, rub or other extra sounds. Abdomen:  Bowel sounds positive,abdomen soft and non-tender without  masses, organomegaly or hernias noted. Msk:  No deformity or scoliosis noted of thoracic or lumbar spine.   Pulses:  R and L carotid,radial,femoral,dorsalis pedis and posterior  tibial pulses are full and equal bilaterally Extremities:  No clubbing, cyanosis, edema, or deformity noted with normal full range of motion of all joints.   Neurologic:  No cranial nerve deficits noted. Station and gait are normal. Plantar reflexes are down-going bilaterally. DTRs are symmetrical throughout. Sensory, motor and coordinative functions appear intact. Skin:  Intact without suspicious lesions or rashes.......scar right breast from previous removal of an S. K. by Dr. Terri Piedra, her dermatologist Cervical Nodes:  No lymphadenopathy noted Axillary Nodes:  No palpable lymphadenopathy Inguinal Nodes:  No significant adenopathy Psych:  Cognition and judgment appear intact. Alert and cooperative with normal attention span and concentration. No apparent delusions, illusions, hallucinations   Impression & Recommendations:  Problem # 1:  HYPERLIPIDEMIA (ICD-272.4) Assessment Improved  Her updated medication list for this problem includes:    Zocor 20 Mg Tabs (Simvastatin) .Marland Kitchen... Take 1 tablet by mouth at bedtime  Orders: Prescription Created Electronically [phone]) EKG w/ Interpretation ([address])  Problem # 2:  HYPERTENSION (ICD-401.9) Assessment: Improved  Her updated medication list for this problem includes:    Dyazide 37.5-25 Mg Caps (Triamterene-hctz) .Marland Kitchen... Take 1 tablet by mouth every morning  Orders: Prescription Created Electronically [phone]) EKG w/ Interpretation (93000)  Problem # 3:  GERD (ICD-530.81) Assessment: Improved  Her updated medication list for this problem includes:    Prilosec Otc 20 Mg Tbec (Omeprazole magnesium) .Marland Kitchen... Take 1 tablet by mouth once a day  Orders: Prescription Created Electronically [phone])  Problem # 4:  ALLERGIC RHINITIS (ICD-477.9) Assessment: Improved  Problem # 5:  DYSFNCT ASSO W/SLEEP STGES/AROUSAL FRM SLEEP (ICD-780.56) Assessment: New  Orders: Prescription Created Electronically [phone]) Pulmonary Referral  (Pulmonary)  Problem # 6:  NONSPECIFIC ABNORMAL RESULTS LIVR FUNCTION STUDY (ICD-794.8) Assessment: New  Orders: Prescription Created Electronically [phone]) Gastroenterology Referral (GI)  Complete Medication List: 1)  Prilosec Otc 20 Mg Tbec (Omeprazole magnesium) .... Take 1 tablet by mouth once a day 2)  Aspirin 81 Mg Tabs (Aspirin) .... Sometimes 3)  Zocor 20 Mg Tabs (Simvastatin) .... Take 1 tablet by mouth at bedtime 4)  Dyazide 37.5-25 Mg Caps (Triamterene-hctz) .... Take 1 tablet by mouth every morning  Patient Instructions: 1)  I would recommend we get a GI consult because of view.  Her sister's history of liver disease and he and the slight abnormality of your liver function tests.  The slight elevation is most likely due to the Zocor.  Let's stop the Zocor temporarily. 2)  I would also recommend a pulmonary consult with Dr. Mellody Dance for evaluation of possible sleep apnea 3)  Please schedule a follow-up appointment in 1 year. 4)  It is important that you exercise regularly at least 20 minutes 5 times a week. If you develop chest pain, have severe difficulty breathing, or feel very tired , stop exercising immediately and seek medical attention. 5)  Take calcium +Vitamin D daily. 6)  Take an Aspirin every day. Prescriptions: DYAZIDE 37.5-25 MG CAPS (TRIAMTERENE-HCTZ) Take 1 tablet by mouth every morning  #100 x 3   Entered and Authorized by:   Roderick Pee MD   Signed by:   Roderick Pee MD on 03/14/2009   Method used:   Electronically to        [facility]* (mail-order)             ,  Ph: [phone]       Fax: [phone]   RxID:   [id number] PRILOSEC OTC 20 MG  TBEC (OMEPRAZOLE MAGNESIUM) Take 1 tablet by mouth once a day  #100 x 4   Entered and Authorized by:   Roderick Pee MD   Signed by:   Roderick Pee MD on 03/14/2009   Method used:   Electronically to        [facility]* (mail-order)             ,          Ph: [phone]       Fax:  [phone]   RxID:   [id number]

## 2010-03-11 NOTE — Progress Notes (Signed)
Summary: cpap problem  Phone Note Call from Patient Call back at Work Phone [phone]   Caller: Patient Call For: clance Reason for Call: Talk to Nurse Summary of Call: cpap machine doesn't seem to be doing her any good.  90 days up in August for rental -  Not rested, irritates face, not feeling any better.  It is on automatic. cpold something be wrong w/mask? Initial call taken by: Eugene Gavia,  August 29, 2009 10:38 AM  Follow-up for Phone Call        called and spoke with pt and she stated that the pressure is not too high and that part is fine.  she stated that the mask is leaving marks on her face--she is not feeling rested--she is fighting with the mask--she feels like her head is in a vice.  she has had several days with no data on it due to the card.  please advise. thanks Randell Loop CMA  August 29, 2009 10:44 AM   Additional Follow-up for Phone Call Additional follow up Details #1::        If she feels her head is in a vice, and mask is leaving marks on her face, the mask is pulled too tight!!  We can change her mask to a different type if she is having issues.  We still have not received the auto download from dme....let pt know this.  We need to get this from them..please work on.   Additional Follow-up by: Barbaraann Share MD,  August 30, 2009 1:33 PM    Additional Follow-up for Phone Call Additional follow up Details #2::    called spoke with patient.  advised of KC's recs.  pt states that the mask is not too tight, just ill-fitting; would like new mask.  order sent to evaluate pt for different mask.  download located in [facility] paperwork and placed in his "very important look-at." Follow-up by: Boone Master CNA/MA,  August 30, 2009 3:42 PM   Appended Document: cpap problem received download and put in your very important look at folder for you to review.   Appended Document: cpap problem let pt know that her optimal pressure is 10cm.  Hopefully getting a new mask and her  machine set on 10cm will help.  will send order to [facility].  Appended Document: cpap problem [id number]  Appended Document: cpap problem called and spoke with pt. pt aware of KC's recs and order sent to [facility].  pt verbalized understanding.

## 2010-03-11 NOTE — Progress Notes (Signed)
Summary: results  Phone Note Call from Patient   Caller: Patient Call For: clance Summary of Call: pt had sleep study done 4/1. wants to know results. call [phone] Initial call taken by: Tivis Ringer, CNA,  May 28, 2009 11:15 AM  Follow-up for Phone Call        Pt requesting sleep study results from 05/10/09.  Pt informed it takes 2 wks to get results back and unfortunatly results would have been ready this week however KC is out of office this wk.  Informed her he will return on Monday.  She is ok with this.  Will forward message to [facility].  Gweneth Dimitri RN  May 28, 2009 11:23 AM   Additional Follow-up for Phone Call Additional follow up Details #1::        I have read her study this past weekend.  Ok to schedule rov with me for the week I get back. Additional Follow-up by: Barbaraann Share MD,  May 29, 2009 7:24 PM    Additional Follow-up for Phone Call Additional follow up Details #2::    please schedule her on MOnday 06-03-2009 at 2:30pm with Dr. Shelle Iron.  Thank you.  Aundra Millet Reynolds LPN  May 30, 2009 8:25 AM   pt scheduled for monday 06/03/2009 at2:30pm..Megan Reynolds LPN  May 30, 2009 8:48 AM

## 2010-03-13 NOTE — Miscellaneous (Signed)
Summary: BONE DENSITY  Clinical Lists Changes  Orders: Added new Test order of T-Bone Densitometry (77080) - Signed Added new Test order of T-Lumbar Vertebral Assessment (77082) - Signed 

## 2010-04-03 ENCOUNTER — Other Ambulatory Visit: Payer: Self-pay | Admitting: Family Medicine

## 2010-04-30 LAB — CREATININE, SERUM
Creatinine, Ser: 0.86 mg/dL (ref 0.4–1.2)
GFR calc non Af Amer: >60 mL/min (ref >60)

## 2010-06-23 ENCOUNTER — Other Ambulatory Visit: Payer: Self-pay

## 2010-06-24 ENCOUNTER — Other Ambulatory Visit: Payer: Self-pay

## 2010-06-30 ENCOUNTER — Ambulatory Visit: Payer: Self-pay | Admitting: Family Medicine

## 2010-07-01 ENCOUNTER — Ambulatory Visit: Payer: Self-pay | Admitting: Family Medicine

## 2010-08-14 ENCOUNTER — Encounter: Payer: Self-pay | Admitting: Family Medicine

## 2010-08-19 ENCOUNTER — Other Ambulatory Visit (INDEPENDENT_AMBULATORY_CARE_PROVIDER_SITE_OTHER): Payer: 59

## 2010-08-19 ENCOUNTER — Other Ambulatory Visit: Payer: Self-pay | Admitting: Family Medicine

## 2010-08-19 DIAGNOSIS — Z0000 Encounter for general adult medical examination without abnormal findings: Secondary | ICD-10-CM

## 2010-08-19 LAB — HEPATIC FUNCTION PANEL
ALT: 45 U/L — ABNORMAL HIGH (ref 0–35)
AST: 36 U/L (ref 0–37)
Albumin: 4.4 g/dL (ref 3.5–5.2)

## 2010-08-19 LAB — BASIC METABOLIC PANEL
Calcium: 9.5 mg/dL (ref 8.4–10.5)
GFR: 63.01 mL/min (ref >60.00)
Sodium: 139 mEq/L (ref 135–145)

## 2010-08-19 LAB — CBC WITH DIFFERENTIAL/PLATELET
Basophils Absolute: 0.1 10*3/uL (ref 0.0–0.1)
Eosinophils Absolute: 0.3 10*3/uL (ref 0.0–0.7)
HCT: 42 % (ref 36.0–46.0)
Lymphocytes Relative: 30 % (ref 12.0–46.0)
Lymphs Abs: 2.4 10*3/uL (ref 0.7–4.0)
Monocytes Relative: 5.8 % (ref 3.0–12.0)
Platelets: 273 10*3/uL (ref 150.0–400.0)
RDW: 13.8 % (ref 11.5–14.6)

## 2010-08-19 LAB — URINALYSIS
Bilirubin Urine: NEGATIVE
Hgb urine dipstick: NEGATIVE
Total Protein, Urine: NEGATIVE
Urine Glucose: NEGATIVE

## 2010-08-19 LAB — LDL CHOLESTEROL, DIRECT: Direct LDL: 206.2 mg/dL

## 2010-08-19 LAB — TSH: TSH: 2.21 u[IU]/mL (ref 0.35–5.50)

## 2010-08-19 LAB — LIPID PANEL
HDL: 63.6 mg/dL (ref >39.00)
Triglycerides: 138 mg/dL (ref 0.0–149.0)

## 2010-08-26 ENCOUNTER — Ambulatory Visit: Payer: Self-pay | Admitting: Family Medicine

## 2010-09-22 ENCOUNTER — Ambulatory Visit (INDEPENDENT_AMBULATORY_CARE_PROVIDER_SITE_OTHER): Payer: 59 | Admitting: Family Medicine

## 2010-09-22 ENCOUNTER — Encounter: Payer: Self-pay | Admitting: Family Medicine

## 2010-09-22 DIAGNOSIS — Z23 Encounter for immunization: Secondary | ICD-10-CM

## 2010-09-22 DIAGNOSIS — Z0000 Encounter for general adult medical examination without abnormal findings: Secondary | ICD-10-CM

## 2010-09-22 DIAGNOSIS — G4720 Circadian rhythm sleep disorder, unspecified type: Secondary | ICD-10-CM

## 2010-09-22 DIAGNOSIS — E785 Hyperlipidemia, unspecified: Secondary | ICD-10-CM

## 2010-09-22 DIAGNOSIS — I10 Essential (primary) hypertension: Secondary | ICD-10-CM

## 2010-09-22 MED ORDER — ALPRAZOLAM 0.5 MG PO TABS: 0.5000 mg | ORAL_TABLET | Freq: Every evening | ORAL | Status: DC | PRN

## 2010-09-22 MED ORDER — TRIAMTERENE-HCTZ 37.5-25 MG PO TABS: 1.0000 | ORAL_TABLET | Freq: Every day | ORAL | Status: DC

## 2010-09-22 NOTE — Progress Notes (Signed)
Subjective:    Patient ID: Joan Mahoney, female    DOB: 1946/01/26, 65 y.o.   MRN: [id number]  HPI Joan Mahoney is a 65 year old female, nonsmoker, who comes in today for general physical examination because of a history of hypertension, hyperlipidemia, osteoporosis, hypertension, and a new problem of right shoulder pain and an umbilical hernia with a lump and her CPAP issue.  She takes calcium, vitamin D, stop the Fosamax as she been on it for over 3 years.  Her bone density from 2009 showed a T score of -1.8.  She stopped her Zocor because of elevation of her liver functions.  She's afraid to restarting those medications because her sister developed cirrhosis.  She takes Maxzide 37.5 -- 25 daily for hypertension.  BP at home 137/62.  She had a small umbilical hernia and a lump.  Her weights the same 170, 2.  She's not been walking or watching her diet.  She saw Dr. Stann Mainland was put on CPAP for 5 months.  It didn't seem to help so she stopped using it.  Advised to go back and see him for reevaluation.  Tetanus today, mammogram January normal, shingles.  Vaccine information given, colonoscopy, 2011 normal except for some diverticula.  She also takes alprazolam .5 once or twice per week for anxiety.  This was given to her by Dr. Elana Alm her gynecologist, who has since retired.  She wants a GYN exam, however, she's got diarrhea would like to put off for a couple weeks.  She gets routine eye ,,,,,,,,,,,,  She has contacts.  Regular dental care.Breast exam monthly, and a new mammography as noted above for   Review of Systems  Constitutional: Negative.   HENT: Negative.   Eyes: Negative.   Respiratory: Negative.   Cardiovascular: Negative.   Gastrointestinal: Negative.   Genitourinary: Negative.   Musculoskeletal: Negative.   Neurological: Negative.   Hematological: Negative.   Psychiatric/Behavioral: Negative.        Objective:   Physical Exam  Constitutional: She appears  well-developed and well-nourished.  HENT:  Head: Normocephalic and atraumatic.  Right Ear: External ear normal.  Left Ear: External ear normal.  Nose: Nose normal.  Mouth/Throat: Oropharynx is clear and moist.  Eyes: EOM are normal. Pupils are equal, round, and reactive to light.  Neck: Normal range of motion. Neck supple. No thyromegaly present.  Cardiovascular: Normal rate, regular rhythm, normal heart sounds and intact distal pulses.  Exam reveals no gallop and no friction rub.   No murmur heard. Pulmonary/Chest: Effort normal and breath sounds normal.  Abdominal: Soft. Bowel sounds are normal. She exhibits no distension and no mass. There is no tenderness. There is no rebound.  Musculoskeletal: Normal range of motion.  Lymphadenopathy:    She has no cervical adenopathy.  Neurological: She is alert. She has normal reflexes. No cranial nerve deficit. She exhibits normal muscle tone. Coordination normal.  Skin: Skin is warm and dry.  Psychiatric: She has a normal mood and affect. Her behavior is normal. Judgment and thought content normal.          Assessment & Plan:  Healthy female.  Hypertension.  BP at home 135/62.  Continue Maxzide one daily.  Anxiety L. Triazolam .5 p.r.n.  Hyperlipidemia.  Declines therapy because of her side effects of the medication.  Right shoulder pain referred to Dr. Norlene Campbell.  Small umbilical hernia with a lipoma observed.  Overweight.  Begin diet and exercise program.  Sleep apnea.  We check with pulmonary  Return in one month for pelvic exam

## 2010-09-22 NOTE — Patient Instructions (Signed)
Begin a walking program 20 minutes daily.  Call Dr. Norlene Campbell, orthopedist, for consult on your shoulder.  Continue current medications.  Return in one month for pelvic exam.  Call Dr. Mellody Dance in pulmonary for reevaluation of the sleep apnea

## 2010-10-23 ENCOUNTER — Ambulatory Visit (INDEPENDENT_AMBULATORY_CARE_PROVIDER_SITE_OTHER): Payer: 59 | Admitting: Family Medicine

## 2010-10-23 ENCOUNTER — Encounter: Payer: Self-pay | Admitting: Family Medicine

## 2010-10-23 DIAGNOSIS — N9089 Other specified noninflammatory disorders of vulva and perineum: Secondary | ICD-10-CM

## 2010-10-23 DIAGNOSIS — R7309 Other abnormal glucose: Secondary | ICD-10-CM

## 2010-10-23 LAB — GLUCOSE, POCT (MANUAL RESULT ENTRY): POC Glucose: 98

## 2010-10-23 MED ORDER — ESTRADIOL 0.1 MG/GM VA CREA: 1.0000 g | TOPICAL_CREAM | Freq: Every day | VAGINAL | Status: DC

## 2010-10-23 NOTE — Patient Instructions (Signed)
Use small amounts of the hormonal cream twice weekly.  Return in one year, sooner if any problem

## 2010-10-23 NOTE — Progress Notes (Signed)
  Subjective:    Patient ID: Joan Mahoney, female    DOB: 1945/08/27, 65 y.o.   MRN: [id number]  HPI  Joan Mahoney is a 65 year old female, who comes in today for a pelvic examination.  We did her physical examination a couple weeks ago, however, that time.  She had an upset stomach, and did not look want a pelvic exam done at that time.  She had her uterus removed for nonmalignant reasons.  Review of Systems    General and GYN review of systems negative Objective:   Physical Exam  Pelvic examination external genitalia normal limits.  Vaginal vault was normal.  No abnormal lesions.  Cervix has been removed with the uterus.  There is extreme amount of vaginal dryness and a 2+ cystocele.  There is also cystic lesion at the 8 o'clock position at the introitus.  Bimanual exam negative.  Rectal normal stool guaiac-negative      Assessment & Plan:  Normal pelvic exam except for postmenopausal vaginal dryness.  Plan Premarin vaginal cream.  Small amounts twice weekly.  Cystocele asymptomatic

## 2011-03-10 ENCOUNTER — Other Ambulatory Visit: Payer: Self-pay | Admitting: Family Medicine

## 2011-03-10 DIAGNOSIS — Z1231 Encounter for screening mammogram for malignant neoplasm of breast: Secondary | ICD-10-CM

## 2011-03-16 ENCOUNTER — Ambulatory Visit
Admission: RE | Admit: 2011-03-16 | Discharge: 2011-03-16 | Disposition: A | Discharge status: Home / Self Care | Payer: 59 | Source: Ambulatory Visit | Attending: Family Medicine | Admitting: Family Medicine

## 2011-03-16 DIAGNOSIS — Z1231 Encounter for screening mammogram for malignant neoplasm of breast: Secondary | ICD-10-CM

## 2011-04-08 ENCOUNTER — Other Ambulatory Visit: Payer: Self-pay | Admitting: Otolaryngology

## 2011-04-08 ENCOUNTER — Ambulatory Visit
Admission: RE | Admit: 2011-04-08 | Discharge: 2011-04-08 | Disposition: A | Discharge status: Home / Self Care | Payer: 59 | Source: Ambulatory Visit | Attending: Otolaryngology | Admitting: Otolaryngology

## 2011-04-08 DIAGNOSIS — R062 Wheezing: Secondary | ICD-10-CM

## 2011-04-08 DIAGNOSIS — R05 Cough: Secondary | ICD-10-CM

## 2011-04-08 DIAGNOSIS — R059 Cough, unspecified: Secondary | ICD-10-CM

## 2011-04-29 ENCOUNTER — Other Ambulatory Visit (HOSPITAL_COMMUNITY): Payer: Self-pay | Admitting: Otolaryngology

## 2011-04-29 DIAGNOSIS — D333 Benign neoplasm of cranial nerves: Secondary | ICD-10-CM

## 2011-05-06 ENCOUNTER — Encounter: Payer: Self-pay | Admitting: Family Medicine

## 2011-05-06 ENCOUNTER — Telehealth: Payer: Self-pay | Admitting: Family Medicine

## 2011-05-06 ENCOUNTER — Ambulatory Visit (INDEPENDENT_AMBULATORY_CARE_PROVIDER_SITE_OTHER): Payer: 59 | Admitting: Family Medicine

## 2011-05-06 VITALS — BP 120/80 | Temp 98.8°F | Wt 173.0 lb

## 2011-05-06 DIAGNOSIS — J069 Acute upper respiratory infection, unspecified: Secondary | ICD-10-CM

## 2011-05-06 DIAGNOSIS — J309 Allergic rhinitis, unspecified: Secondary | ICD-10-CM

## 2011-05-06 MED ORDER — MONTELUKAST SODIUM 10 MG PO TABS: 10.0000 mg | ORAL_TABLET | Freq: Every day | ORAL | Status: DC

## 2011-05-06 MED ORDER — HYDROCODONE-HOMATROPINE 5-1.5 MG/5ML PO SYRP: ORAL_SOLUTION | ORAL | Status: DC

## 2011-05-06 NOTE — Progress Notes (Signed)
  Subjective:    Patient ID: Joan Mahoney, female    DOB: 27-Apr-1945, 66 y.o.   MRN: [id number]  HPI Joan Mahoney is a 66 year old female nonsmoker who comes in today for evaluation of a febrile illness  Last week she flew to the [facility] been on this past Sunday he developed head congestion and Monday he developed fever chills. She's had a history of allergic rhinitis in the past and takes Allegra on a daily basis  She states she went to see her ear nose and throat doctor Dr. Joneen Roach a couple months ago with similar symptoms and he gave her an antibiotic with a diagnosis of sinusitis. No x-rays or diagnostic studies were done.  She was also on allergy shots for 30 years   Review of Systems General and cardiopulmonary is systems otherwise negative    Objective:   Physical Exam  Well-developed well-nourished female in no acute distress HEENT negative neck was supple no adenopathy lungs are clear no wheezing      Assessment & Plan:  Viral syndrome plan treat symptomatically #2 allergic rhinitis continue antihistamines

## 2011-05-06 NOTE — Telephone Encounter (Signed)
Spoke to pt, and she will come in this am to see Dr. Tawanna Cooler for possible sinus infection.

## 2011-05-06 NOTE — Telephone Encounter (Signed)
Pt called req work in ov with Dr Tawanna Cooler today re: cough, fever, chills, since Sunday. Pt req med for cough.  [facility] in [place].

## 2011-05-06 NOTE — Patient Instructions (Signed)
Drink lots of water  For your allergy symptoms I would recommend a combination of Allegra and Singulair one of each daily  For your viral infection I would recommend Hydromet 1/2-1 teaspoon 3 times daily as needed for cough and cold  Return when necessary

## 2011-05-21 ENCOUNTER — Ambulatory Visit (HOSPITAL_COMMUNITY)
Admission: RE | Admit: 2011-05-21 | Discharge: 2011-05-21 | Disposition: A | Discharge status: Home / Self Care | Payer: 59 | Source: Ambulatory Visit | Attending: Otolaryngology | Admitting: Otolaryngology

## 2011-05-21 DIAGNOSIS — D333 Benign neoplasm of cranial nerves: Secondary | ICD-10-CM | POA: Insufficient documentation

## 2011-05-21 DIAGNOSIS — G319 Degenerative disease of nervous system, unspecified: Secondary | ICD-10-CM | POA: Insufficient documentation

## 2011-05-21 LAB — CREATININE, SERUM: Creatinine, Ser: 0.99 mg/dL (ref 0.50–1.10)

## 2011-05-21 MED ORDER — GADOBENATE DIMEGLUMINE 529 MG/ML IV SOLN
17.0000 mL | Freq: Once | INTRAVENOUS | Status: AC | PRN
  Administered 2011-05-21: 17 mL via INTRAVENOUS

## 2011-05-27 ENCOUNTER — Telehealth: Payer: Self-pay | Admitting: Family Medicine

## 2011-05-27 NOTE — Telephone Encounter (Signed)
Patient called stating that she need a new 90 day  rx for aprazolam sent to [facility]. Please assist.

## 2011-05-28 NOTE — Telephone Encounter (Signed)
Not prescribed by Dr Tawanna Cooler

## 2011-05-29 ENCOUNTER — Telehealth: Payer: Self-pay | Admitting: Family Medicine

## 2011-05-29 NOTE — Telephone Encounter (Signed)
Pt called and said that Dr Tawanna Cooler wrote a script for ALPRAZolam Prudy Feeler) 0.5 MG tablet on 09/22/10, that pt never had filled. Pls [facility] in [facility] supply. Pt said that she can fax a copy of the script that Dr Tawanna Cooler wrote, if needed.

## 2011-06-01 NOTE — Telephone Encounter (Signed)
Per Dr Tawanna Cooler he no longer uses Xanax because of side effects

## 2011-06-02 NOTE — Telephone Encounter (Signed)
Spoke with pt, pt is aware that Dr. Tawanna Cooler no longer prescribes Xanax due to side effects. Pt said she will call back when Dr. Tawanna Cooler is back because she wants a new script on Xanax

## 2011-06-10 ENCOUNTER — Telehealth: Payer: Self-pay | Admitting: Family Medicine

## 2011-06-10 MED ORDER — LORAZEPAM 0.5 MG PO TABS: 0.5000 mg | ORAL_TABLET | Freq: Two times a day (BID) | ORAL | Status: DC | PRN

## 2011-06-10 NOTE — Telephone Encounter (Signed)
Patient called stating that she would like a refill of her xanax or aprazolam sent to [facility]. Please assist.

## 2011-06-10 NOTE — Telephone Encounter (Signed)
Ativan 0.5 mg dispense 60 tabs directions one by mouth twice a day when necessary refills x3

## 2011-06-19 ENCOUNTER — Telehealth: Payer: Self-pay | Admitting: Family Medicine

## 2011-06-19 MED ORDER — LORAZEPAM 0.5 MG PO TABS: 0.5000 mg | ORAL_TABLET | Freq: Two times a day (BID) | ORAL | Status: AC | PRN

## 2011-06-19 NOTE — Telephone Encounter (Signed)
rx faxed

## 2011-06-19 NOTE — Telephone Encounter (Signed)
Pt called and said that script for LORazepam (ATIVAN) 0.5 MG tablet to [facility]. Pls send 90 day supply with refills.

## 2011-08-05 ENCOUNTER — Other Ambulatory Visit: Payer: Self-pay | Admitting: *Deleted

## 2011-08-05 DIAGNOSIS — J309 Allergic rhinitis, unspecified: Secondary | ICD-10-CM

## 2011-08-05 MED ORDER — MONTELUKAST SODIUM 10 MG PO TABS: 10.0000 mg | ORAL_TABLET | Freq: Every day | ORAL | Status: DC

## 2011-11-22 ENCOUNTER — Other Ambulatory Visit: Payer: Self-pay | Admitting: Family Medicine

## 2012-01-27 ENCOUNTER — Other Ambulatory Visit (INDEPENDENT_AMBULATORY_CARE_PROVIDER_SITE_OTHER): Payer: 59

## 2012-01-27 DIAGNOSIS — Z0000 Encounter for general adult medical examination without abnormal findings: Secondary | ICD-10-CM

## 2012-01-27 LAB — CBC WITH DIFFERENTIAL/PLATELET
Basophils Absolute: 0.1 10*3/uL (ref 0.0–0.1)
Basophils Relative: 1.5 % (ref 0.0–3.0)
Eosinophils Relative: 4.4 % (ref 0.0–5.0)
HCT: 40.9 % (ref 36.0–46.0)
Hemoglobin: 13.7 g/dL (ref 12.0–15.0)
Lymphs Abs: 2 10*3/uL (ref 0.7–4.0)
Monocytes Relative: 6.9 % (ref 3.0–12.0)
Neutro Abs: 3.3 10*3/uL (ref 1.4–7.7)
RBC: 4.62 Mil/uL (ref 3.87–5.11)
RDW: 13.1 % (ref 11.5–14.6)

## 2012-01-27 LAB — BASIC METABOLIC PANEL
BUN: 23 mg/dL (ref 6–23)
CO2: 27 mEq/L (ref 19–32)
Calcium: 9.4 mg/dL (ref 8.4–10.5)
Chloride: 105 mEq/L (ref 96–112)
Creatinine, Ser: 0.9 mg/dL (ref 0.4–1.2)
GFR: 64.29 mL/min (ref >60.00)
Glucose, Bld: 109 mg/dL — ABNORMAL HIGH (ref 70–99)
Potassium: 3.8 mEq/L (ref 3.5–5.1)
Sodium: 140 mEq/L (ref 135–145)

## 2012-01-27 LAB — HEPATIC FUNCTION PANEL
Alkaline Phosphatase: 83 U/L (ref 39–117)
Bilirubin, Direct: 0 mg/dL (ref 0.0–0.3)
Total Protein: 7.6 g/dL (ref 6.0–8.3)

## 2012-01-27 LAB — POCT URINALYSIS DIPSTICK
Bilirubin, UA: NEGATIVE
Glucose, UA: NEGATIVE
Ketones, UA: NEGATIVE
Leukocytes, UA: NEGATIVE
Nitrite, UA: NEGATIVE

## 2012-01-27 LAB — LIPID PANEL
Cholesterol: 237 mg/dL — ABNORMAL HIGH (ref 0–200)
HDL: 57.9 mg/dL (ref >39.00)
Total CHOL/HDL Ratio: 4
Triglycerides: 98 mg/dL (ref 0.0–149.0)
VLDL: 19.6 mg/dL (ref 0.0–40.0)

## 2012-01-27 LAB — TSH: TSH: 1.34 u[IU]/mL (ref 0.35–5.50)

## 2012-02-15 ENCOUNTER — Ambulatory Visit (INDEPENDENT_AMBULATORY_CARE_PROVIDER_SITE_OTHER): Payer: 59 | Admitting: Family Medicine

## 2012-02-15 ENCOUNTER — Encounter: Payer: Self-pay | Admitting: Family Medicine

## 2012-02-15 VITALS — BP 140/88 | Temp 98.8°F | Ht 62.5 in | Wt 174.0 lb

## 2012-02-15 DIAGNOSIS — G4733 Obstructive sleep apnea (adult) (pediatric): Secondary | ICD-10-CM

## 2012-02-15 DIAGNOSIS — F419 Anxiety disorder, unspecified: Secondary | ICD-10-CM

## 2012-02-15 DIAGNOSIS — I10 Essential (primary) hypertension: Secondary | ICD-10-CM

## 2012-02-15 DIAGNOSIS — F411 Generalized anxiety disorder: Secondary | ICD-10-CM

## 2012-02-15 DIAGNOSIS — K219 Gastro-esophageal reflux disease without esophagitis: Secondary | ICD-10-CM

## 2012-02-15 DIAGNOSIS — Z23 Encounter for immunization: Secondary | ICD-10-CM

## 2012-02-15 DIAGNOSIS — J309 Allergic rhinitis, unspecified: Secondary | ICD-10-CM

## 2012-02-15 MED ORDER — TRIAMTERENE-HCTZ 37.5-25 MG PO TABS: 1.0000 | ORAL_TABLET | Freq: Every day | ORAL | Status: DC

## 2012-02-15 MED ORDER — MONTELUKAST SODIUM 10 MG PO TABS: 10.0000 mg | ORAL_TABLET | Freq: Every day | ORAL | Status: DC

## 2012-02-15 MED ORDER — LORAZEPAM 0.5 MG PO TABS: 0.5000 mg | ORAL_TABLET | Freq: Every day | ORAL | Status: DC

## 2012-02-15 MED ORDER — OMEPRAZOLE 20 MG PO CPDR: 20.0000 mg | DELAYED_RELEASE_CAPSULE | Freq: Every day | ORAL | Status: DC

## 2012-02-15 NOTE — Patient Instructions (Signed)
Continued at current medications  I would reconsult with Dr. Mellody Dance about the sleep apnea issue  Due the kegal  exercises twice daily and use small amounts of the hormonal cream twice weekly  Return in one year for general physical examination sooner if any problems  Call your insurance company about the shingles vaccine

## 2012-02-15 NOTE — Progress Notes (Signed)
  Subjective:    Patient ID: Joan Mahoney, female    DOB: 09/05/1945, 67 y.o.   MRN: [id number]  HPI Haizel  s a 67 year old married female nonsmoker who comes in today for general physical examination  She has a history of sleep dysfunction she takes an occasional 0.5 mg Ativan tablet and she is supposed to be on CPAP. She saw Dr. Mellody Dance and she doesn't think the device works well for her.  She takes Maxzide 1 daily for hypertension BP 140/88  She takes Singulair and Allegra for allergic rhinitis  She also takes an 81 mg baby aspirin.  She had a TAH and BSO years ago for nonmalignant reasons. We gave her some hormonal cream to use last year because of vaginal dryness and urinary incontinence however she's not been using it.  She gets routine eye care, dental care, and you mammography, colonoscopy normal, flu shot 2013 Pneumovax today information given on shingles    Review of Systems  Constitutional: Negative.   HENT: Negative.   Eyes: Negative.   Respiratory: Negative.   Cardiovascular: Negative.   Gastrointestinal: Negative.   Genitourinary: Negative.   Musculoskeletal: Negative.   Neurological: Negative.   Hematological: Negative.   Psychiatric/Behavioral: Negative.        Objective:   Physical Exam  Constitutional: She appears well-developed and well-nourished.  HENT:  Head: Normocephalic and atraumatic.  Right Ear: External ear normal.  Left Ear: External ear normal.  Nose: Nose normal.  Mouth/Throat: Oropharynx is clear and moist.  Eyes: EOM are normal. Pupils are equal, round, and reactive to light.  Neck: Normal range of motion. Neck supple. No thyromegaly present.  Cardiovascular: Normal rate, regular rhythm, normal heart sounds and intact distal pulses.  Exam reveals no gallop and no friction rub.   No murmur heard. Pulmonary/Chest: Effort normal and breath sounds normal.  Abdominal: Soft. Bowel sounds are normal. She exhibits no distension and no mass.  There is no tenderness. There is no rebound.  Genitourinary:       Bilateral breast exam normal she was taught how to do BSE monthly  Musculoskeletal: Normal range of motion.  Lymphadenopathy:    She has no cervical adenopathy.  Neurological: She is alert. She has normal reflexes. No cranial nerve deficit. She exhibits normal muscle tone. Coordination normal.  Skin: Skin is warm and dry.  Psychiatric: She has a normal mood and affect. Her behavior is normal. Judgment and thought content normal.          Assessment & Plan:  Healthy female  Hypertension continue Maxzide 1 daily  Reflux esophagitis continue Prilosec 20 mg daily  Allergic rhinitis continue Allegra and Singulair  Sleep dysfunction lorazepam 0.5 each bedtime when necessary refer back to Dr. Mellody Dance to visit the sleep apnea issue  Urinary incontinence use the hormonal cream twice weekly and do Kegel exercises twice daily

## 2012-05-31 ENCOUNTER — Other Ambulatory Visit: Payer: Self-pay

## 2012-05-31 DIAGNOSIS — Z1231 Encounter for screening mammogram for malignant neoplasm of breast: Secondary | ICD-10-CM

## 2012-06-27 ENCOUNTER — Ambulatory Visit
Admission: RE | Admit: 2012-06-27 | Discharge: 2012-06-27 | Disposition: A | Discharge status: Home / Self Care | Payer: 59 | Source: Ambulatory Visit

## 2012-06-27 DIAGNOSIS — Z1231 Encounter for screening mammogram for malignant neoplasm of breast: Secondary | ICD-10-CM

## 2012-06-29 ENCOUNTER — Other Ambulatory Visit: Payer: Self-pay | Admitting: Family Medicine

## 2012-06-29 DIAGNOSIS — R928 Other abnormal and inconclusive findings on diagnostic imaging of breast: Secondary | ICD-10-CM

## 2012-06-30 ENCOUNTER — Ambulatory Visit
Admission: RE | Admit: 2012-06-30 | Discharge: 2012-06-30 | Disposition: A | Discharge status: Home / Self Care | Payer: 59 | Source: Ambulatory Visit | Attending: Family Medicine | Admitting: Family Medicine

## 2012-06-30 DIAGNOSIS — R928 Other abnormal and inconclusive findings on diagnostic imaging of breast: Secondary | ICD-10-CM

## 2012-07-05 ENCOUNTER — Other Ambulatory Visit: Payer: 59

## 2012-07-07 ENCOUNTER — Other Ambulatory Visit: Payer: Self-pay | Admitting: Allergy and Immunology

## 2012-07-07 ENCOUNTER — Ambulatory Visit
Admission: RE | Admit: 2012-07-07 | Discharge: 2012-07-07 | Disposition: A | Discharge status: Home / Self Care | Payer: 59 | Source: Ambulatory Visit | Attending: Allergy and Immunology | Admitting: Allergy and Immunology

## 2012-07-07 DIAGNOSIS — R062 Wheezing: Secondary | ICD-10-CM

## 2012-08-07 ENCOUNTER — Other Ambulatory Visit: Payer: Self-pay | Admitting: Family Medicine

## 2012-10-24 ENCOUNTER — Other Ambulatory Visit: Payer: Self-pay | Admitting: Family Medicine

## 2012-11-08 ENCOUNTER — Other Ambulatory Visit: Payer: Self-pay | Admitting: Family Medicine

## 2013-01-02 ENCOUNTER — Other Ambulatory Visit: Payer: Self-pay | Admitting: Family Medicine

## 2013-01-02 DIAGNOSIS — N6310 Unspecified lump in the right breast, unspecified quadrant: Secondary | ICD-10-CM

## 2013-01-18 ENCOUNTER — Ambulatory Visit
Admission: RE | Admit: 2013-01-18 | Discharge: 2013-01-18 | Disposition: A | Discharge status: Home / Self Care | Payer: 59 | Source: Ambulatory Visit | Attending: Family Medicine | Admitting: Family Medicine

## 2013-01-18 DIAGNOSIS — N6310 Unspecified lump in the right breast, unspecified quadrant: Secondary | ICD-10-CM

## 2013-04-12 ENCOUNTER — Other Ambulatory Visit: Payer: Self-pay | Admitting: Family Medicine

## 2013-04-14 ENCOUNTER — Other Ambulatory Visit: Payer: Self-pay | Admitting: Family Medicine

## 2013-04-19 ENCOUNTER — Other Ambulatory Visit (INDEPENDENT_AMBULATORY_CARE_PROVIDER_SITE_OTHER): Payer: 59

## 2013-04-19 DIAGNOSIS — E785 Hyperlipidemia, unspecified: Secondary | ICD-10-CM

## 2013-04-19 DIAGNOSIS — I10 Essential (primary) hypertension: Secondary | ICD-10-CM

## 2013-04-19 LAB — BASIC METABOLIC PANEL
BUN: 23 mg/dL (ref 6–23)
CALCIUM: 9.7 mg/dL (ref 8.4–10.5)
CO2: 31 mEq/L (ref 19–32)
CREATININE: 0.9 mg/dL (ref 0.4–1.2)
Chloride: 103 mEq/L (ref 96–112)
GFR: 64.04 mL/min (ref >60.00)
Glucose, Bld: 107 mg/dL — ABNORMAL HIGH (ref 70–99)
Potassium: 3.3 mEq/L — ABNORMAL LOW (ref 3.5–5.1)
SODIUM: 140 meq/L (ref 135–145)

## 2013-04-19 LAB — POCT URINALYSIS DIPSTICK
Blood, UA: NEGATIVE
Glucose, UA: NEGATIVE
Leukocytes, UA: NEGATIVE
NITRITE UA: NEGATIVE
Spec Grav, UA: >=1.03
UROBILINOGEN UA: 0.2
pH, UA: 6

## 2013-04-19 LAB — LIPID PANEL
CHOLESTEROL: 240 mg/dL — AB (ref 0–200)
HDL: 54.4 mg/dL (ref >39.00)
LDL Cholesterol: 163 mg/dL — ABNORMAL HIGH (ref 0–99)
TRIGLYCERIDES: 115 mg/dL (ref 0.0–149.0)
Total CHOL/HDL Ratio: 4
VLDL: 23 mg/dL (ref 0.0–40.0)

## 2013-04-19 LAB — HEPATIC FUNCTION PANEL
ALT: 37 U/L — AB (ref 0–35)
AST: 31 U/L (ref 0–37)
Albumin: 4.3 g/dL (ref 3.5–5.2)
Alkaline Phosphatase: 85 U/L (ref 39–117)
Bilirubin, Direct: 0 mg/dL (ref 0.0–0.3)
Total Bilirubin: 0.7 mg/dL (ref 0.3–1.2)
Total Protein: 7.8 g/dL (ref 6.0–8.3)

## 2013-04-19 LAB — CBC WITH DIFFERENTIAL/PLATELET
BASOS ABS: 0.1 10*3/uL (ref 0.0–0.1)
Basophils Relative: 1.3 % (ref 0.0–3.0)
Eosinophils Absolute: 0.4 10*3/uL (ref 0.0–0.7)
Eosinophils Relative: 5.1 % — ABNORMAL HIGH (ref 0.0–5.0)
HCT: 42.4 % (ref 36.0–46.0)
HEMOGLOBIN: 14.5 g/dL (ref 12.0–15.0)
Lymphocytes Relative: 30.5 % (ref 12.0–46.0)
Lymphs Abs: 2.5 10*3/uL (ref 0.7–4.0)
MCHC: 34.2 g/dL (ref 30.0–36.0)
MCV: 90.3 fl (ref 78.0–100.0)
MONOS PCT: 6.2 % (ref 3.0–12.0)
Monocytes Absolute: 0.5 10*3/uL (ref 0.1–1.0)
NEUTROS ABS: 4.7 10*3/uL (ref 1.4–7.7)
Neutrophils Relative %: 56.9 % (ref 43.0–77.0)
PLATELETS: 275 10*3/uL (ref 150.0–400.0)
RBC: 4.7 Mil/uL (ref 3.87–5.11)
RDW: 12.7 % (ref 11.5–14.6)
WBC: 8.2 10*3/uL (ref 4.5–10.5)

## 2013-04-19 LAB — TSH: TSH: 2.09 u[IU]/mL (ref 0.35–5.50)

## 2013-04-26 ENCOUNTER — Ambulatory Visit (INDEPENDENT_AMBULATORY_CARE_PROVIDER_SITE_OTHER): Payer: 59 | Admitting: Family Medicine

## 2013-04-26 ENCOUNTER — Encounter: Payer: Self-pay | Admitting: Family Medicine

## 2013-04-26 VITALS — BP 180/110 | Temp 98.3°F | Ht 62.0 in | Wt 176.0 lb

## 2013-04-26 DIAGNOSIS — K219 Gastro-esophageal reflux disease without esophagitis: Secondary | ICD-10-CM

## 2013-04-26 DIAGNOSIS — E785 Hyperlipidemia, unspecified: Secondary | ICD-10-CM

## 2013-04-26 DIAGNOSIS — R030 Elevated blood-pressure reading, without diagnosis of hypertension: Secondary | ICD-10-CM

## 2013-04-26 DIAGNOSIS — Z23 Encounter for immunization: Secondary | ICD-10-CM

## 2013-04-26 DIAGNOSIS — I10 Essential (primary) hypertension: Secondary | ICD-10-CM

## 2013-04-26 DIAGNOSIS — J309 Allergic rhinitis, unspecified: Secondary | ICD-10-CM

## 2013-04-26 DIAGNOSIS — Z0000 Encounter for general adult medical examination without abnormal findings: Secondary | ICD-10-CM

## 2013-04-26 DIAGNOSIS — E782 Mixed hyperlipidemia: Secondary | ICD-10-CM

## 2013-04-26 MED ORDER — LORAZEPAM 0.5 MG PO TABS: ORAL_TABLET | ORAL | Status: DC

## 2013-04-26 MED ORDER — TRIAMTERENE-HCTZ 37.5-25 MG PO TABS: 1.0000 | ORAL_TABLET | Freq: Every day | ORAL | Status: DC

## 2013-04-26 MED ORDER — OMEPRAZOLE 20 MG PO CPDR: 20.0000 mg | DELAYED_RELEASE_CAPSULE | Freq: Every day | ORAL | Status: DC

## 2013-04-26 NOTE — Progress Notes (Signed)
   Subjective:    Patient ID: Joan Mahoney, female    DOB: [birth year], 68 y.o.   MRN: [id number]  HPI Corianne is a 68 year old married female nonsmoker his can retire from [employer] in 4 days who comes in today for a Medicare wellness examination  She takes Human resources officer for allergic rhinitis, Ativan when necessary for anxiety, Singulair for her allergist for allergic rhinitis  She takes Prilosec 20 mg for reflux Maxide 37.5 mg daily for fluid retention. BP today one eighty over one 10 repeat by me 170/90. Positive family history of hypertension but she's never had high blood pressure in the past herself. Symbicort was added by her pulmonologist for asthma. She also has a history of sleep apnea.  She gets routine eye care, dental care, BSE monthly, mammogram in May showed a spot followup in November normal shrinking lesion there for probably cystic  Colonoscopy and GI  She does have low cyst around her umbilicus.  She's not walking on a regular basis.  She'll uterus and ovaries removed for nonmalignant reasons pelvic examination by GYN  Cognitive function normal she does not exercise on a regular basis home health safety reviewed no issues identified, no guns in the house, she does have a health care power of attorney and living well   Review of Systems  Constitutional: Negative.   HENT: Negative.   Eyes: Negative.   Respiratory: Negative.   Cardiovascular: Negative.   Gastrointestinal: Negative.   Genitourinary: Negative.   Musculoskeletal: Negative.   Neurological: Negative.   Psychiatric/Behavioral: Negative.        Objective:   Physical Exam  Nursing note and vitals reviewed. Constitutional: She appears well-developed and well-nourished.  HENT:  Head: Normocephalic and atraumatic.  Right Ear: External ear normal.  Left Ear: External ear normal.  Nose: Nose normal.  Mouth/Throat: Oropharynx is clear and moist.  Eyes: EOM are normal. Pupils are equal, round, and reactive to  light.  Neck: Normal range of motion. Neck supple. No thyromegaly present.  Cardiovascular: Normal rate, regular rhythm, normal heart sounds and intact distal pulses.  Exam reveals no gallop and no friction rub.   No murmur heard. No carotid artery bruits peripheral pulses 2+ and symmetrical  Pulmonary/Chest: Effort normal and breath sounds normal.  Abdominal: Soft. Bowel sounds are normal. She exhibits no distension and no mass. There is no tenderness. There is no rebound.  She has a slight umbilical hernia into the right of the umbilicus there is a lipoma. It's soft rubbery movable and been present for many years  Genitourinary:  Bilateral breast exam......Marland Kitchen except for a marble-sized lesion right breast 10:00 an inch from the nipple. It soft rubbery movable normal pelvic deferred to GYN  Musculoskeletal: Normal range of motion.  Lymphadenopathy:    She has no cervical adenopathy.  Neurological: She is alert. She has normal reflexes. No cranial nerve deficit. She exhibits normal muscle tone. Coordination normal.  Skin: Skin is warm and dry.  Total body skin exam normal  Psychiatric: She has a normal mood and affect. Her behavior is normal. Judgment and thought content normal.          Assessment & Plan:  Allergic rhinitis and asthma followed by allergist  Ativan for anxiety when necessary  Reflux esophagitis Prilosec when necessary  Fluid retention Maxide 1 daily  Elevated blood pressure. Check BP daily at home for 3 weeks followup if abnormal  Overweight begin a diet and exercise program

## 2013-04-26 NOTE — Patient Instructions (Signed)
Now that you're returning begin a diet and exercise program.  Check your blood pressure daily in the morning for the next 3 weeks. If in 3 weeks your blood pressures normal 140/90 or less then I would just check it weekly going forward  If however your blood pressures are all elevated return to see me with the data and the device  Omron digital blood pressure cuff......... pump up

## 2013-04-26 NOTE — Progress Notes (Signed)
Pre visit review using our clinic review tool, if applicable. No additional management support is needed unless otherwise documented below in the visit note. 

## 2013-07-13 ENCOUNTER — Other Ambulatory Visit: Payer: Self-pay | Admitting: Family Medicine

## 2013-07-13 DIAGNOSIS — N6310 Unspecified lump in the right breast, unspecified quadrant: Secondary | ICD-10-CM

## 2013-07-24 ENCOUNTER — Other Ambulatory Visit: Payer: Self-pay | Admitting: Family Medicine

## 2013-07-24 ENCOUNTER — Ambulatory Visit
Admission: RE | Admit: 2013-07-24 | Discharge: 2013-07-24 | Disposition: A | Discharge status: Home / Self Care | Payer: Medicare FFS | Source: Ambulatory Visit | Attending: Family Medicine | Admitting: Family Medicine

## 2013-07-24 DIAGNOSIS — N6310 Unspecified lump in the right breast, unspecified quadrant: Secondary | ICD-10-CM

## 2013-08-17 ENCOUNTER — Telehealth: Payer: Self-pay | Admitting: Family Medicine

## 2013-08-17 NOTE — Telephone Encounter (Signed)
Prescriptions were at the pharmacy already, I spoke with the pt

## 2013-08-17 NOTE — Telephone Encounter (Signed)
Pt needs new rxs to pick up. Pt is requesting omeprazole 20 mg #90,triamterene-hctz 37.5-25 mg #90 each with refills

## 2013-10-09 ENCOUNTER — Telehealth: Payer: Self-pay | Admitting: Family Medicine

## 2013-10-09 DIAGNOSIS — J309 Allergic rhinitis, unspecified: Secondary | ICD-10-CM

## 2013-10-09 MED ORDER — MONTELUKAST SODIUM 10 MG PO TABS
10.0000 mg | ORAL_TABLET | Freq: Every day | ORAL | Status: DC
Start: 2013-10-09 — End: 2014-07-03

## 2013-10-09 MED ORDER — MONTELUKAST SODIUM 10 MG PO TABS: 10.0000 mg | ORAL_TABLET | Freq: Every day | ORAL | Status: DC

## 2013-10-09 NOTE — Telephone Encounter (Signed)
Pt needs new rx montelukast 10 mg #90 with refills sent to [facility]

## 2013-10-09 NOTE — Telephone Encounter (Signed)
[facility]. pls delete this pharm from her list and [facility].

## 2013-10-09 NOTE — Addendum Note (Signed)
Addended by: Westley Hummer B on: 10/09/2013 12:08 PM   Modules accepted: Orders

## 2013-12-10 ENCOUNTER — Other Ambulatory Visit: Payer: Self-pay | Admitting: Family Medicine

## 2013-12-13 ENCOUNTER — Telehealth: Payer: Self-pay | Admitting: Family Medicine

## 2013-12-13 NOTE — Telephone Encounter (Signed)
Rx already called in. 

## 2013-12-13 NOTE — Telephone Encounter (Signed)
Pt request refill of the following: LORazepam (ATIVAN) 0.5 MG tablet ° ° °Phamacy: [facility]  ° °

## 2014-05-20 ENCOUNTER — Other Ambulatory Visit: Payer: Self-pay | Admitting: Family Medicine

## 2014-05-21 ENCOUNTER — Other Ambulatory Visit: Payer: Self-pay | Admitting: Family Medicine

## 2014-06-18 ENCOUNTER — Telehealth: Payer: Self-pay | Admitting: Family Medicine

## 2014-06-18 MED ORDER — TRIAMTERENE-HCTZ 37.5-25 MG PO TABS: 1.0000 | ORAL_TABLET | Freq: Every day | ORAL | Status: DC

## 2014-06-18 NOTE — Telephone Encounter (Signed)
Refill sent.

## 2014-06-18 NOTE — Telephone Encounter (Signed)
Pt request refill triamterene-hydrochlorothiazide (MAXZIDE-25) 37.5-25 MG per tablet Pt has made cpe for 5/24. However will be out of this rx. [facility] day [facility]

## 2014-06-28 ENCOUNTER — Other Ambulatory Visit (INDEPENDENT_AMBULATORY_CARE_PROVIDER_SITE_OTHER): Payer: PPO

## 2014-06-28 DIAGNOSIS — Z0000 Encounter for general adult medical examination without abnormal findings: Secondary | ICD-10-CM

## 2014-06-28 LAB — LIPID PANEL
CHOLESTEROL: 246 mg/dL — AB (ref 0–200)
HDL: 64.5 mg/dL (ref >39.00)
LDL CALC: 162 mg/dL — AB (ref 0–99)
NonHDL: 181.5
Total CHOL/HDL Ratio: 4
Triglycerides: 100 mg/dL (ref 0.0–149.0)
VLDL: 20 mg/dL (ref 0.0–40.0)

## 2014-06-28 LAB — BASIC METABOLIC PANEL
BUN: 22 mg/dL (ref 6–23)
CHLORIDE: 101 meq/L (ref 96–112)
CO2: 29 mEq/L (ref 19–32)
Calcium: 10.3 mg/dL (ref 8.4–10.5)
Creatinine, Ser: 1.01 mg/dL (ref 0.40–1.20)
GFR: 58.01 mL/min — ABNORMAL LOW (ref >60.00)
Glucose, Bld: 101 mg/dL — ABNORMAL HIGH (ref 70–99)
POTASSIUM: 3.9 meq/L (ref 3.5–5.1)
Sodium: 138 mEq/L (ref 135–145)

## 2014-06-28 LAB — CBC WITH DIFFERENTIAL/PLATELET
BASOS ABS: 0.1 10*3/uL (ref 0.0–0.1)
Basophils Relative: 1.2 % (ref 0.0–3.0)
EOS ABS: 0.4 10*3/uL (ref 0.0–0.7)
Eosinophils Relative: 5 % (ref 0.0–5.0)
HEMATOCRIT: 44.6 % (ref 36.0–46.0)
Hemoglobin: 15.2 g/dL — ABNORMAL HIGH (ref 12.0–15.0)
LYMPHS ABS: 2.7 10*3/uL (ref 0.7–4.0)
Lymphocytes Relative: 37.8 % (ref 12.0–46.0)
MCHC: 34.1 g/dL (ref 30.0–36.0)
MCV: 88.3 fl (ref 78.0–100.0)
Monocytes Absolute: 0.5 10*3/uL (ref 0.1–1.0)
Monocytes Relative: 6.8 % (ref 3.0–12.0)
NEUTROS ABS: 3.5 10*3/uL (ref 1.4–7.7)
Neutrophils Relative %: 49.2 % (ref 43.0–77.0)
Platelets: 274 10*3/uL (ref 150.0–400.0)
RBC: 5.05 Mil/uL (ref 3.87–5.11)
RDW: 13.3 % (ref 11.5–15.5)
WBC: 7.2 10*3/uL (ref 4.0–10.5)

## 2014-06-28 LAB — HEPATIC FUNCTION PANEL
ALK PHOS: 83 U/L (ref 39–117)
ALT: 35 U/L (ref 0–35)
AST: 31 U/L (ref 0–37)
Albumin: 4.5 g/dL (ref 3.5–5.2)
BILIRUBIN DIRECT: 0.1 mg/dL (ref 0.0–0.3)
Total Bilirubin: 0.5 mg/dL (ref 0.2–1.2)
Total Protein: 8 g/dL (ref 6.0–8.3)

## 2014-06-28 LAB — POCT URINALYSIS DIPSTICK
Bilirubin, UA: NEGATIVE
Blood, UA: NEGATIVE
Glucose, UA: NEGATIVE
Ketones, UA: NEGATIVE
LEUKOCYTES UA: NEGATIVE
Nitrite, UA: NEGATIVE
PROTEIN UA: NEGATIVE
Spec Grav, UA: 1.02
Urobilinogen, UA: 0.2
pH, UA: 7

## 2014-06-28 LAB — TSH: TSH: 1.28 u[IU]/mL (ref 0.35–4.50)

## 2014-07-03 ENCOUNTER — Ambulatory Visit (INDEPENDENT_AMBULATORY_CARE_PROVIDER_SITE_OTHER): Payer: PPO | Admitting: Family Medicine

## 2014-07-03 ENCOUNTER — Encounter: Payer: Self-pay | Admitting: Family Medicine

## 2014-07-03 VITALS — BP 124/84 | Temp 98.3°F | Ht 62.0 in | Wt 166.0 lb

## 2014-07-03 DIAGNOSIS — J302 Other seasonal allergic rhinitis: Secondary | ICD-10-CM | POA: Diagnosis not present

## 2014-07-03 DIAGNOSIS — J4520 Mild intermittent asthma, uncomplicated: Secondary | ICD-10-CM

## 2014-07-03 DIAGNOSIS — K219 Gastro-esophageal reflux disease without esophagitis: Secondary | ICD-10-CM

## 2014-07-03 DIAGNOSIS — J45909 Unspecified asthma, uncomplicated: Secondary | ICD-10-CM | POA: Insufficient documentation

## 2014-07-03 DIAGNOSIS — I10 Essential (primary) hypertension: Secondary | ICD-10-CM | POA: Diagnosis not present

## 2014-07-03 DIAGNOSIS — E785 Hyperlipidemia, unspecified: Secondary | ICD-10-CM

## 2014-07-03 MED ORDER — TRIAMTERENE-HCTZ 37.5-25 MG PO TABS: 1.0000 | ORAL_TABLET | Freq: Every day | ORAL | Status: DC

## 2014-07-03 MED ORDER — BUDESONIDE-FORMOTEROL FUMARATE 80-4.5 MCG/ACT IN AERO: 2.0000 | INHALATION_SPRAY | RESPIRATORY_TRACT | Status: DC

## 2014-07-03 MED ORDER — OMEPRAZOLE 20 MG PO CPDR: 20.0000 mg | DELAYED_RELEASE_CAPSULE | Freq: Every day | ORAL | Status: DC

## 2014-07-03 MED ORDER — MONTELUKAST SODIUM 10 MG PO TABS: 10.0000 mg | ORAL_TABLET | Freq: Every day | ORAL | Status: DC

## 2014-07-03 MED ORDER — LORAZEPAM 0.5 MG PO TABS: 0.5000 mg | ORAL_TABLET | Freq: Every day | ORAL | Status: DC

## 2014-07-03 NOTE — Progress Notes (Signed)
Subjective:    Patient ID: Joan Mahoney, female    DOB: 1945/10/19, 69 y.o.   MRN: [id number]  HPI Joan Mahoney is a 69 year old married female nonsmoker who comes in today for general physical examination because of a history of allergic rhinitis, asthma, mild anxiety, reflux esophagitis, mild hypertension  She takes Symbicort from her allergist 2 puffs daily. She would like to get her medication filled here her symptoms are stable she doesn't wish to go back she's had no exacerbations of her asthma since she's been on the Symbicort  She takes Ativan 0.5 mg daily at bedtime 45 times a week because of insomnia and anxiety  She also takes Singulair and plain Claritin for allergic rhinitis  She takes Prilosec for chronic reflux and Maxide 25 for mild hypertension. BP today 124/84  She gets routine eye care, dental care, colonoscopy 2 however the computer says she's never had a colonoscopy. The computer is incorrect. Her last colonoscopy was about 5 years ago was normal  Vaccinations up-to-date except she's doing shingles vaccine  Cognitive function normal she does not walk on a regular basis encouraged to do so, home health safety reviewed no issues identified, no guns in the house, she does have a healthcare power of attorney and living well.  She would like a referral to rheumatologist Dr. Naida Sleight because of nodules in her fingers.  She's due to go back to see her ENT surgeon in [place]. She had acoustic neuroma left ear removed many years ago. She goes back periodically for follow-up and MRIs. Suggest she get follow-up here she declines.  She had her uterus and ovaries removed many years ago for nonmalignant reasons therefore pelvic and Paps are low longer indicated.   Review of Systems  Constitutional: Negative.   HENT: Negative.   Eyes: Negative.   Respiratory: Negative.   Cardiovascular: Negative.   Gastrointestinal: Negative.   Endocrine: Negative.   Genitourinary:  Negative.   Musculoskeletal: Negative.   Skin: Negative.   Allergic/Immunologic: Negative.   Neurological: Negative.   Hematological: Negative.   Psychiatric/Behavioral: Negative.        Objective:   Physical Exam  Constitutional: She appears well-developed and well-nourished.  HENT:  Head: Normocephalic and atraumatic.  Right Ear: External ear normal.  Left Ear: External ear normal.  Nose: Nose normal.  Mouth/Throat: Oropharynx is clear and moist.  Eyes: EOM are normal. Pupils are equal, round, and reactive to light.  Neck: Normal range of motion. Neck supple. No JVD present. No tracheal deviation present. No thyromegaly present.  Cardiovascular: Normal rate, regular rhythm, normal heart sounds and intact distal pulses.  Exam reveals no gallop and no friction rub.   No murmur heard. Pulmonary/Chest: Effort normal and breath sounds normal. No stridor. No respiratory distress. She has no wheezes. She has no rales. She exhibits no tenderness.  Abdominal: Soft. Bowel sounds are normal. She exhibits no distension and no mass. There is no tenderness. There is no rebound and no guarding.  Genitourinary:  Bilateral breast exam normal  Musculoskeletal: Normal range of motion.  Lymphadenopathy:    She has no cervical adenopathy.  Neurological: She is alert. She has normal reflexes. No cranial nerve deficit. She exhibits normal muscle tone. Coordination normal.  Skin: Skin is warm and dry. No rash noted. No erythema. No pallor.  Total body skin exam normal she sees her dermatologist yearly  Psychiatric: She has a normal mood and affect. Her behavior is normal. Judgment and thought content normal.  Assessment & Plan:  Healthy female  Hypertension ago continue Maxide  History of asthma continue Symbicort 2 puffs daily  Mild anxiety Ativan 0.5 daily at bedtime when necessary  Allergic rhinitis continue Singulair including Claritin  Chronic reflux continue Prilosec 20  daily  Status post acoustic neuroma recommended follow-up by ENT surgeon.  Nodules in her fingers refer to Dr. rheumatology for further evaluation per patient request

## 2014-07-03 NOTE — Patient Instructions (Signed)
Begin a walking program 30 minutes daily  Continue current medications  Follow-up in one year sooner if any problems  Rachel's extension is [phone]........ check with your insurance company about the shingles vaccine  [employer]........Marland Kitchen our new adult nurse practitioner from [facility]

## 2014-07-16 ENCOUNTER — Other Ambulatory Visit: Payer: Self-pay

## 2014-07-16 DIAGNOSIS — Z1231 Encounter for screening mammogram for malignant neoplasm of breast: Secondary | ICD-10-CM

## 2014-07-27 ENCOUNTER — Ambulatory Visit
Admission: RE | Admit: 2014-07-27 | Discharge: 2014-07-27 | Disposition: A | Discharge status: Home / Self Care | Payer: PPO | Source: Ambulatory Visit

## 2014-07-27 DIAGNOSIS — Z1231 Encounter for screening mammogram for malignant neoplasm of breast: Secondary | ICD-10-CM

## 2014-10-10 ENCOUNTER — Other Ambulatory Visit: Payer: Self-pay | Admitting: Otolaryngology

## 2014-10-10 DIAGNOSIS — D333 Benign neoplasm of cranial nerves: Secondary | ICD-10-CM

## 2014-10-23 ENCOUNTER — Ambulatory Visit
Admission: RE | Admit: 2014-10-23 | Discharge: 2014-10-23 | Disposition: A | Discharge status: Home / Self Care | Payer: PPO | Source: Ambulatory Visit | Attending: Otolaryngology | Admitting: Otolaryngology

## 2014-10-23 DIAGNOSIS — D333 Benign neoplasm of cranial nerves: Secondary | ICD-10-CM

## 2014-10-23 MED ORDER — GADOBENATE DIMEGLUMINE 529 MG/ML IV SOLN
15.0000 mL | Freq: Once | INTRAVENOUS | Status: AC | PRN
  Administered 2014-10-23: 15 mL via INTRAVENOUS

## 2015-01-17 ENCOUNTER — Other Ambulatory Visit: Payer: Self-pay | Admitting: Family Medicine

## 2015-02-25 ENCOUNTER — Encounter: Payer: Self-pay | Admitting: Family Medicine

## 2015-02-25 ENCOUNTER — Ambulatory Visit (INDEPENDENT_AMBULATORY_CARE_PROVIDER_SITE_OTHER): Payer: PPO | Admitting: Family Medicine

## 2015-02-25 VITALS — BP 136/88 | HR 95 | Temp 98.2°F | Ht 62.0 in | Wt 165.8 lb

## 2015-02-25 DIAGNOSIS — R300 Dysuria: Secondary | ICD-10-CM | POA: Diagnosis not present

## 2015-02-25 DIAGNOSIS — N390 Urinary tract infection, site not specified: Secondary | ICD-10-CM | POA: Diagnosis not present

## 2015-02-25 DIAGNOSIS — I10 Essential (primary) hypertension: Secondary | ICD-10-CM

## 2015-02-25 LAB — POCT URINALYSIS DIPSTICK
BILIRUBIN UA: NEGATIVE
GLUCOSE UA: NEGATIVE
KETONES UA: NEGATIVE
Nitrite, UA: NEGATIVE
PROTEIN UA: NEGATIVE
SPEC GRAV UA: 1.015
Urobilinogen, UA: 0.2
pH, UA: 6

## 2015-02-25 MED ORDER — NITROFURANTOIN MONOHYD MACRO 100 MG PO CAPS: 100.0000 mg | ORAL_CAPSULE | Freq: Two times a day (BID) | ORAL | Status: DC

## 2015-02-25 NOTE — Addendum Note (Signed)
Addended by: Agnes Lawrence on: 02/25/2015 11:33 AM   Modules accepted: Orders

## 2015-02-25 NOTE — Progress Notes (Signed)
Pre visit review using our clinic review tool, if applicable. No additional management support is needed unless otherwise documented below in the visit note. 

## 2015-02-25 NOTE — Patient Instructions (Signed)
Take the antibiotic as instructed  Follow up with your doctor for your physical and as needed if any symptoms persist  -We have ordered labs or studies at this visit. It can take up to 1-2 weeks for results and processing. We will contact you with instructions IF your results are abnormal. Normal results will be released to your [facility]. If you have not heard from [place] or can not find your results in [facility] in 2 weeks please contact our office.

## 2015-02-25 NOTE — Progress Notes (Signed)
HPI:  Acute visit for:  Dysuria: -x4 days -burning and pressure with urination -denies: fevers, malaise, nausea, hematuria, vaginal symptoms, flank pain  Elevated Blood pressure: -on medications -elevated on arrival -reports is always fine at home -denies: CP, SOB, HA  ROS: See pertinent positives and negatives per HPI.  Past Medical History  Diagnosis Date  . GERD (gastroesophageal reflux disease)   . Hypertension   . Hyperlipidemia   . Obstructive sleep apnea   . Allergy     No past surgical history on file.  No family history on file.  Social History   Social History  . Marital Status: Married    Spouse Name: N/A  . Number of Children: N/A  . Years of Education: N/A   Social History Main Topics  . Smoking status: Former Research scientist (life sciences)  . Smokeless tobacco: None  . Alcohol Use: None  . Drug Use: None  . Sexual Activity: Not Asked   Other Topics Concern  . None   Social History Narrative     Current outpatient prescriptions:  .  Amlodipine-Olmesartan (AZOR PO), Take by mouth as needed., Disp: , Rfl:  .  aspirin 81 MG tablet, Take 81 mg by mouth daily.  , Disp: , Rfl:  .  budesonide-formoterol (SYMBICORT) 80-4.5 MCG/ACT inhaler, Inhale 2 puffs into the lungs 1 day or 1 dose., Disp: 3 Inhaler, Rfl: 3 .  loratadine (CLARITIN) 10 MG tablet, Take 10 mg by mouth daily., Disp: , Rfl:  .  LORazepam (ATIVAN) 0.5 MG tablet, TAKE ONE TABLET BY MOUTH ONCE DAILY, Disp: 60 tablet, Rfl: 3 .  montelukast (SINGULAIR) 10 MG tablet, Take 1 tablet (10 mg total) by mouth at bedtime., Disp: 100 tablet, Rfl: 3 .  omeprazole (PRILOSEC) 20 MG capsule, Take 1 capsule (20 mg total) by mouth daily., Disp: 100 capsule, Rfl: 3 .  triamterene-hydrochlorothiazide (MAXZIDE-25) 37.5-25 MG per tablet, Take 1 tablet by mouth daily., Disp: 100 tablet, Rfl: 3 .  nitrofurantoin, macrocrystal-monohydrate, (MACROBID) 100 MG capsule, Take 1 capsule (100 mg total) by mouth 2 (two) times daily., Disp: 14  capsule, Rfl: 0  EXAM:  Filed Vitals:   02/25/15 1107  BP: 136/88  Pulse: 95  Temp: 98.2 F (36.8 C)    Body mass index is 30.32 kg/(m^2).  GENERAL: vitals reviewed and listed above, alert, oriented, appears well hydrated and in no acute distress  HEENT: atraumatic, conjunttiva clear, no obvious abnormalities on inspection of external nose and ears  NECK: no obvious masses on inspection  LUNGS: clear to auscultation bilaterally, no wheezes, rales or rhonchi, good air movement  CV: HRRR, no peripheral edema  ABD: BS+, soft, NTTP, no CVA TTP  MS: moves all extremities without noticeable abnormality  PSYCH: pleasant and cooperative, no obvious depression or anxiety  ASSESSMENT AND PLAN:  Discussed the following assessment and plan:  Dysuria - Plan: POC Urinalysis Dipstick  UTI (lower urinary tract infection)  Essential hypertension  -BP much better of recheck and she reports monitors at home with normal levels -Udip with blood and leuks, she opted to start empiric tx for likely UTI as she is going to be traveling, culture pending -CPE with PCP when due  -Patient advised to return or notify a doctor immediately if symptoms worsen or persist or new concerns arise.  Patient Instructions  Take the antibiotic as instructed  Follow up with your doctor for your physical and as needed if any symptoms persist  -We have ordered labs or studies at this visit.  It can take up to 1-2 weeks for results and processing. We will contact you with instructions IF your results are abnormal. Normal results will be released to your [facility]. If you have not heard from [place] or can not find your results in [facility] in 2 weeks please contact our office.            Joan Benton R.

## 2015-02-28 LAB — URINE CULTURE: Colony Count: >=100000

## 2015-03-07 ENCOUNTER — Telehealth: Payer: Self-pay | Admitting: Family Medicine

## 2015-03-07 NOTE — Telephone Encounter (Signed)
Pt states Dr Maudie Mercury prescribed nitrofurantoin, macrocrystal-monohydrate, (MACROBID) 100 MG capsule for UTI on 02/25/15  Pt states she paid full price but it needed prior auth. Pt has health team advantage and would like the prior done so she can get reimbursed. [facility] on [address] states they sent [place] fax, but did not hear anything.   Pt states she went and paid because she needed.

## 2015-03-11 ENCOUNTER — Telehealth: Payer: Self-pay | Admitting: Family Medicine

## 2015-03-11 MED ORDER — FLUCONAZOLE 150 MG PO TABS: 150.0000 mg | ORAL_TABLET | Freq: Once | ORAL | Status: DC

## 2015-03-11 NOTE — Telephone Encounter (Signed)
Pt said she was taking a antibiotic rx to her by Dr Maudie Mercury and now she has a yeast infection. She said she was told there was a pill she could take. She is asking if this pill can be rx for her.     Joan Mahoney

## 2015-03-11 NOTE — Telephone Encounter (Signed)
Patient informed. 

## 2015-03-11 NOTE — Telephone Encounter (Signed)
rx sent to pharmacy

## 2015-03-11 NOTE — Telephone Encounter (Signed)
Pt is hoping to get rx today. thanks

## 2015-03-18 ENCOUNTER — Other Ambulatory Visit: Payer: Self-pay

## 2015-03-18 MED ORDER — NITROFURANTOIN MONOHYD MACRO 100 MG PO CAPS: 100.0000 mg | ORAL_CAPSULE | Freq: Two times a day (BID) | ORAL | Status: DC

## 2015-03-18 MED ORDER — SULFAMETHOXAZOLE-TRIMETHOPRIM 800-160 MG PO TABS: 1.0000 | ORAL_TABLET | Freq: Two times a day (BID) | ORAL | Status: DC

## 2015-03-18 NOTE — Telephone Encounter (Signed)
Septra DS sent per Dr Sherren Mocha and patient is aware

## 2015-03-18 NOTE — Telephone Encounter (Signed)
Pt has requested refill on Macrobid. Ok to refill this medication?

## 2015-04-08 ENCOUNTER — Telehealth: Payer: Self-pay | Admitting: Family Medicine

## 2015-04-08 MED ORDER — OMEPRAZOLE 40 MG PO CPDR: 40.0000 mg | DELAYED_RELEASE_CAPSULE | Freq: Every day | ORAL | Status: DC

## 2015-04-08 NOTE — Telephone Encounter (Signed)
New rx sent

## 2015-04-08 NOTE — Telephone Encounter (Signed)
Pt call to say that due to her insurance change the following med will need to be change to 40mg  omeprazole (PRILOSEC) 20 MG capsule

## 2015-04-17 DIAGNOSIS — L821 Other seborrheic keratosis: Secondary | ICD-10-CM | POA: Diagnosis not present

## 2015-04-17 DIAGNOSIS — D1801 Hemangioma of skin and subcutaneous tissue: Secondary | ICD-10-CM | POA: Diagnosis not present

## 2015-04-17 DIAGNOSIS — L812 Freckles: Secondary | ICD-10-CM | POA: Diagnosis not present

## 2015-04-26 DIAGNOSIS — M25562 Pain in left knee: Secondary | ICD-10-CM | POA: Diagnosis not present

## 2015-04-27 DIAGNOSIS — M25562 Pain in left knee: Secondary | ICD-10-CM | POA: Diagnosis not present

## 2015-05-13 DIAGNOSIS — M25562 Pain in left knee: Secondary | ICD-10-CM | POA: Diagnosis not present

## 2015-06-25 ENCOUNTER — Encounter: Payer: Self-pay | Admitting: Gastroenterology

## 2015-07-03 ENCOUNTER — Other Ambulatory Visit: Payer: PPO

## 2015-07-10 ENCOUNTER — Encounter: Payer: PPO | Admitting: Family Medicine

## 2015-07-16 ENCOUNTER — Other Ambulatory Visit: Payer: Self-pay | Admitting: Family Medicine

## 2015-07-26 ENCOUNTER — Other Ambulatory Visit: Payer: Self-pay | Admitting: Family Medicine

## 2015-08-27 ENCOUNTER — Other Ambulatory Visit: Payer: Self-pay | Admitting: Family Medicine

## 2015-08-27 DIAGNOSIS — Z1231 Encounter for screening mammogram for malignant neoplasm of breast: Secondary | ICD-10-CM

## 2015-08-29 ENCOUNTER — Ambulatory Visit
Admission: RE | Admit: 2015-08-29 | Discharge: 2015-08-29 | Disposition: A | Discharge status: Home / Self Care | Payer: PPO | Source: Ambulatory Visit | Attending: Family Medicine | Admitting: Family Medicine

## 2015-08-29 DIAGNOSIS — Z1231 Encounter for screening mammogram for malignant neoplasm of breast: Secondary | ICD-10-CM

## 2015-09-17 ENCOUNTER — Other Ambulatory Visit: Payer: Self-pay | Admitting: Family Medicine

## 2015-09-17 NOTE — Telephone Encounter (Signed)
Rx refill sent to pharmacy. 

## 2015-10-01 DIAGNOSIS — H2513 Age-related nuclear cataract, bilateral: Secondary | ICD-10-CM | POA: Diagnosis not present

## 2015-10-01 DIAGNOSIS — Z0100 Encounter for examination of eyes and vision without abnormal findings: Secondary | ICD-10-CM | POA: Diagnosis not present

## 2015-10-17 DIAGNOSIS — D2312 Other benign neoplasm of skin of left eyelid, including canthus: Secondary | ICD-10-CM | POA: Diagnosis not present

## 2015-10-20 ENCOUNTER — Other Ambulatory Visit: Payer: Self-pay | Admitting: Family Medicine

## 2015-10-23 ENCOUNTER — Other Ambulatory Visit: Payer: Self-pay | Admitting: Family Medicine

## 2015-11-26 ENCOUNTER — Other Ambulatory Visit (INDEPENDENT_AMBULATORY_CARE_PROVIDER_SITE_OTHER): Payer: PPO

## 2015-11-26 DIAGNOSIS — Z0000 Encounter for general adult medical examination without abnormal findings: Secondary | ICD-10-CM | POA: Diagnosis not present

## 2015-11-26 LAB — CBC WITH DIFFERENTIAL/PLATELET
BASOS PCT: 1.1 % (ref 0.0–3.0)
Basophils Absolute: 0.1 10*3/uL (ref 0.0–0.1)
EOS ABS: 0.3 10*3/uL (ref 0.0–0.7)
EOS PCT: 3.3 % (ref 0.0–5.0)
HCT: 44.6 % (ref 36.0–46.0)
HEMOGLOBIN: 15.4 g/dL — AB (ref 12.0–15.0)
LYMPHS ABS: 2.4 10*3/uL (ref 0.7–4.0)
Lymphocytes Relative: 28.8 % (ref 12.0–46.0)
MCHC: 34.6 g/dL (ref 30.0–36.0)
MCV: 88.4 fl (ref 78.0–100.0)
MONO ABS: 0.5 10*3/uL (ref 0.1–1.0)
Monocytes Relative: 5.8 % (ref 3.0–12.0)
NEUTROS PCT: 61 % (ref 43.0–77.0)
Neutro Abs: 5.1 10*3/uL (ref 1.4–7.7)
PLATELETS: 258 10*3/uL (ref 150.0–400.0)
RBC: 5.04 Mil/uL (ref 3.87–5.11)
RDW: 12.9 % (ref 11.5–15.5)
WBC: 8.4 10*3/uL (ref 4.0–10.5)

## 2015-11-26 LAB — POC URINALSYSI DIPSTICK (AUTOMATED)
Blood, UA: NEGATIVE
GLUCOSE UA: NEGATIVE
KETONES UA: NEGATIVE
LEUKOCYTES UA: NEGATIVE
Nitrite, UA: NEGATIVE
SPEC GRAV UA: 1.02
Urobilinogen, UA: 0.2
pH, UA: 6

## 2015-11-26 LAB — HEPATIC FUNCTION PANEL
ALT: 31 U/L (ref 0–35)
AST: 26 U/L (ref 0–37)
Albumin: 4.8 g/dL (ref 3.5–5.2)
Alkaline Phosphatase: 83 U/L (ref 39–117)
BILIRUBIN DIRECT: 0 mg/dL (ref 0.0–0.3)
BILIRUBIN TOTAL: 0.5 mg/dL (ref 0.2–1.2)
Total Protein: 8.4 g/dL — ABNORMAL HIGH (ref 6.0–8.3)

## 2015-11-26 LAB — LIPID PANEL
CHOL/HDL RATIO: 4
CHOLESTEROL: 274 mg/dL — AB (ref 0–200)
HDL: 66.3 mg/dL (ref >39.00)
LDL CALC: 180 mg/dL — AB (ref 0–99)
NonHDL: 207.48
TRIGLYCERIDES: 138 mg/dL (ref 0.0–149.0)
VLDL: 27.6 mg/dL (ref 0.0–40.0)

## 2015-11-26 LAB — BASIC METABOLIC PANEL
BUN: 26 mg/dL — AB (ref 6–23)
CHLORIDE: 101 meq/L (ref 96–112)
CO2: 29 mEq/L (ref 19–32)
CREATININE: 1.14 mg/dL (ref 0.40–1.20)
Calcium: 10.4 mg/dL (ref 8.4–10.5)
GFR: 50.24 mL/min — ABNORMAL LOW (ref >60.00)
GLUCOSE: 104 mg/dL — AB (ref 70–99)
POTASSIUM: 3.9 meq/L (ref 3.5–5.1)
Sodium: 140 mEq/L (ref 135–145)

## 2015-11-26 LAB — TSH: TSH: 2.56 u[IU]/mL (ref 0.35–4.50)

## 2015-12-03 ENCOUNTER — Encounter: Payer: PPO | Admitting: Family Medicine

## 2015-12-09 ENCOUNTER — Ambulatory Visit (INDEPENDENT_AMBULATORY_CARE_PROVIDER_SITE_OTHER): Payer: PPO | Admitting: Family Medicine

## 2015-12-09 ENCOUNTER — Encounter: Payer: Self-pay | Admitting: Family Medicine

## 2015-12-09 VITALS — BP 170/104 | HR 108 | Temp 98.6°F | Ht 61.5 in | Wt 159.8 lb

## 2015-12-09 DIAGNOSIS — Z23 Encounter for immunization: Secondary | ICD-10-CM | POA: Diagnosis not present

## 2015-12-09 DIAGNOSIS — I10 Essential (primary) hypertension: Secondary | ICD-10-CM

## 2015-12-09 DIAGNOSIS — J309 Allergic rhinitis, unspecified: Secondary | ICD-10-CM

## 2015-12-09 DIAGNOSIS — R739 Hyperglycemia, unspecified: Secondary | ICD-10-CM

## 2015-12-09 DIAGNOSIS — Z0000 Encounter for general adult medical examination without abnormal findings: Secondary | ICD-10-CM

## 2015-12-09 DIAGNOSIS — K219 Gastro-esophageal reflux disease without esophagitis: Secondary | ICD-10-CM

## 2015-12-09 MED ORDER — LORAZEPAM 0.5 MG PO TABS: 0.5000 mg | ORAL_TABLET | Freq: Every day | ORAL | 5 refills | Status: DC

## 2015-12-09 MED ORDER — ESCITALOPRAM OXALATE 10 MG PO TABS: 10.0000 mg | ORAL_TABLET | Freq: Every day | ORAL | 3 refills | Status: DC

## 2015-12-09 MED ORDER — TRIAMTERENE-HCTZ 37.5-25 MG PO TABS: 1.0000 | ORAL_TABLET | Freq: Every day | ORAL | 3 refills | Status: DC

## 2015-12-09 MED ORDER — OMEPRAZOLE 40 MG PO CPDR: 40.0000 mg | DELAYED_RELEASE_CAPSULE | Freq: Every day | ORAL | 3 refills | Status: DC

## 2015-12-09 NOTE — Progress Notes (Signed)
Joan Mahoney is a 70 year old single female nonsmoker who comes in today for general physical examination because of a history of allergic rhinitis, reflux esophagitis, hypertension  Her hypertension takes Maxide 25 mg daily. She did not take her medication today. BP elevated 170/90. When she takes her medicine says her blood pressures normal  She takes Prilosec daily for chronic reflux  She takes Singulair from her allergist. She was on Symbicort but only takes it when necessary. Asked her to check with her allergist about that.  She takes Ativan 0.5 daily for anxiety  She gets routine eye care, dental care, BSE monthly, annual mammography, colonoscopy 2011 was normal.  She has by early by collateral cataracts.  Weight is 166 in January 1 59 now. So she's lost 7 pounds since last January. Advised to continue to lose weight.  Vaccinations up-to-date she declines a flu shot.  She brings in a record that she saw on my chart from Dr. Ree Edman. 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 She does not recall ever being told she had an issue like this. Her dermatologist checked her labia 6 months ago they were normal. We will check them again.  14 point review of systems are reviewed and are negative  She had her uterus and ovaries removed for nonmalignant reasons therefore pelvic is not indicated.  Cognitive function normal she does not walk daily home health safety reviewed no issues identified, no guns in the house, she does have a healthcare power of attorney and living well  Physical examination vital signs stable except for elevated blood pressure........ she didn't take her medicine today  HEENT were negative except for early cataracts neck was supple thyroid is not enlarged no carotid bruits cardiopulmonary exam normal breast exam normal abdominal exam normal examination of labia shows no evidence of any abnormal lesions. She does have some venous lakes and some mild  varicosities.  Extremities normal skin no peripheral pulses normal  Impression #1 hypertension not at goal because she's not taking her medication daily......... advised to take her medication daily monitor blood pressure daily return in 4 weeks of blood pressure not at goal 135/85 or less  #2 allergic rhinitis........ followed by allergist  #3 reflux esophagitis........ continue Prilosec  Anxiety...Marland KitchenMarland KitchenMarland Kitchen Ativan 1 mg when necessary  #5 obesity............ continue diet exercise and weight loss.

## 2015-12-09 NOTE — Progress Notes (Signed)
Pre visit review using our clinic review tool, if applicable. No additional management support is needed unless otherwise documented below in the visit note. 

## 2015-12-09 NOTE — Patient Instructions (Signed)
Take your blood pressure medication daily  Check your blood pressure daily in the morning for 4 weeks....... BP goal 135/85 or less.....Marland Kitchen if not at goal return for reevaluation  Omron pump up digital pressure cuff.........Marland Kitchen [employer]  Walk 30 minutes daily........ continue Sugar free diet  Follow-up in one year sooner if any problems.

## 2015-12-20 ENCOUNTER — Other Ambulatory Visit: Payer: Self-pay | Admitting: Family Medicine

## 2016-02-27 ENCOUNTER — Encounter: Payer: Self-pay | Admitting: Family Medicine

## 2016-03-02 MED ORDER — ESCITALOPRAM OXALATE 10 MG PO TABS: 15.0000 mg | ORAL_TABLET | Freq: Every day | ORAL | 1 refills | Status: DC

## 2016-04-23 DIAGNOSIS — L905 Scar conditions and fibrosis of skin: Secondary | ICD-10-CM | POA: Diagnosis not present

## 2016-04-23 DIAGNOSIS — D225 Melanocytic nevi of trunk: Secondary | ICD-10-CM | POA: Diagnosis not present

## 2016-04-23 DIAGNOSIS — D1801 Hemangioma of skin and subcutaneous tissue: Secondary | ICD-10-CM | POA: Diagnosis not present

## 2016-04-23 DIAGNOSIS — L814 Other melanin hyperpigmentation: Secondary | ICD-10-CM | POA: Diagnosis not present

## 2016-05-19 DIAGNOSIS — D3111 Benign neoplasm of right cornea: Secondary | ICD-10-CM | POA: Diagnosis not present

## 2016-05-19 DIAGNOSIS — H43811 Vitreous degeneration, right eye: Secondary | ICD-10-CM | POA: Diagnosis not present

## 2016-05-21 DIAGNOSIS — D3131 Benign neoplasm of right choroid: Secondary | ICD-10-CM | POA: Diagnosis not present

## 2016-05-21 DIAGNOSIS — H43811 Vitreous degeneration, right eye: Secondary | ICD-10-CM | POA: Diagnosis not present

## 2016-06-08 ENCOUNTER — Other Ambulatory Visit (INDEPENDENT_AMBULATORY_CARE_PROVIDER_SITE_OTHER): Payer: PPO

## 2016-06-08 DIAGNOSIS — R739 Hyperglycemia, unspecified: Secondary | ICD-10-CM | POA: Diagnosis not present

## 2016-06-08 LAB — BASIC METABOLIC PANEL
BUN: 26 mg/dL — AB (ref 6–23)
CALCIUM: 9.6 mg/dL (ref 8.4–10.5)
CO2: 30 mEq/L (ref 19–32)
CREATININE: 0.94 mg/dL (ref 0.40–1.20)
Chloride: 100 mEq/L (ref 96–112)
GFR: 62.67 mL/min (ref >60.00)
Glucose, Bld: 86 mg/dL (ref 70–99)
Potassium: 3.6 mEq/L (ref 3.5–5.1)
Sodium: 138 mEq/L (ref 135–145)

## 2016-06-08 LAB — HEMOGLOBIN A1C: Hgb A1c MFr Bld: 5.7 % (ref 4.6–6.5)

## 2016-06-24 DIAGNOSIS — D3131 Benign neoplasm of right choroid: Secondary | ICD-10-CM | POA: Diagnosis not present

## 2016-06-24 DIAGNOSIS — H43811 Vitreous degeneration, right eye: Secondary | ICD-10-CM | POA: Diagnosis not present

## 2016-09-11 ENCOUNTER — Telehealth: Payer: Self-pay | Admitting: Family Medicine

## 2016-09-17 ENCOUNTER — Other Ambulatory Visit: Payer: Self-pay

## 2016-09-17 MED ORDER — ESCITALOPRAM OXALATE 10 MG PO TABS: 15.0000 mg | ORAL_TABLET | Freq: Every day | ORAL | 0 refills | Status: DC

## 2016-09-17 NOTE — Telephone Encounter (Signed)
Pt following up on med request.  Pt states she is out and needs today.   [place], [place] - [address]  (Pt has an appt to see Dr Elease Hashimoto on an unrelated issue 8/13)

## 2016-09-17 NOTE — Telephone Encounter (Signed)
Rx has been E scribed to pt pharmacy.

## 2016-09-21 ENCOUNTER — Ambulatory Visit (INDEPENDENT_AMBULATORY_CARE_PROVIDER_SITE_OTHER): Payer: PPO | Admitting: Family Medicine

## 2016-09-21 VITALS — BP 130/80 | HR 70 | Temp 98.8°F | Wt 162.8 lb

## 2016-09-21 DIAGNOSIS — M67441 Ganglion, right hand: Secondary | ICD-10-CM

## 2016-09-21 DIAGNOSIS — I10 Essential (primary) hypertension: Secondary | ICD-10-CM | POA: Diagnosis not present

## 2016-09-21 DIAGNOSIS — L603 Nail dystrophy: Secondary | ICD-10-CM

## 2016-09-21 DIAGNOSIS — M19041 Primary osteoarthritis, right hand: Secondary | ICD-10-CM | POA: Diagnosis not present

## 2016-09-21 DIAGNOSIS — M19042 Primary osteoarthritis, left hand: Secondary | ICD-10-CM | POA: Diagnosis not present

## 2016-09-21 NOTE — Patient Instructions (Signed)

## 2016-09-21 NOTE — Progress Notes (Signed)
Subjective:     Patient ID: Joan Mahoney, female   DOB: 12-12-1945, 71 y.o.   MRN: [id number]  HPI Patient seen for the following issues  Progressive "arthritis "in her hands involving DIP and PIP joints. Her husband apparently has rheumatoid arthritis. Patient has never had any involvement of her MCP joints, wrists, or other systemic involvement. She's had some gradual nodular changes over several years involving those joints above. She also has early morning stiffness.  Right middle finger superficial cyst just proximal to the nail fold. Nontender. She has some dystrophic nail changes the right middle nail. No abnormal pigmentation.  Hypertension treated with Maxzide. Blood pressure stable. No headaches. No dizziness.  Past Medical History:  Diagnosis Date  . Allergy   . GERD (gastroesophageal reflux disease)   . Hyperlipidemia   . Hypertension   . Obstructive sleep apnea    No past surgical history on file.  reports that she has quit smoking. She has never used smokeless tobacco. Her alcohol and drug histories are not on file. family history is not on file. No Known Allergies   Review of Systems  Constitutional: Negative for fatigue.  Eyes: Negative for visual disturbance.  Respiratory: Negative for cough, chest tightness, shortness of breath and wheezing.   Cardiovascular: Negative for chest pain, palpitations and leg swelling.  Musculoskeletal: Positive for arthralgias.  Neurological: Negative for dizziness, seizures, syncope, weakness, light-headedness and headaches.       Objective:   Physical Exam  Constitutional: She appears well-developed and well-nourished.  Cardiovascular: Normal rate and regular rhythm.   Pulmonary/Chest: Effort normal and breath sounds normal. No respiratory distress. She has no wheezes. She has no rales.  Musculoskeletal: She exhibits no edema.  Patient has nodular thickening consistent with Heberden and Bouchard's nodules involving several  digits of the right left hand PIP and DIP joints. No warmth or erythema.  Skin:  Patient has benign-appearing mucous type cyst dorsal right middle finger distal to the DIP joint. Nontender  Right middle finger nail is somewhat dystrophic. Nail itself appears healthy itself with no brittle changes. No abnormal pigmentation       Assessment:     #1 hypertension stable and at goal  #2 osteoarthritis involving several digits of both hands  #3 benign-appearing mucous cyst right middle finger  #4 dystrophic nail right middle finger.  No evidence for onychomycosis. No history of reported trauma.    Plan:     -We recommend observation regarding benign-appearing cyst of the right middle finger. If growing in size or becoming more painful referral to hand surgeon -Continue cautious use of ibuprofen or Tylenol for osteoarthritis of the hands. -Continue current blood pressure medication. She has follow-up with primary in couple months  Eulas Post MD [facility]

## 2016-09-28 ENCOUNTER — Other Ambulatory Visit: Payer: Self-pay | Admitting: Family Medicine

## 2016-09-30 ENCOUNTER — Other Ambulatory Visit: Payer: Self-pay | Admitting: Family Medicine

## 2016-09-30 DIAGNOSIS — Z1231 Encounter for screening mammogram for malignant neoplasm of breast: Secondary | ICD-10-CM

## 2016-10-08 ENCOUNTER — Ambulatory Visit
Admission: RE | Admit: 2016-10-08 | Discharge: 2016-10-08 | Disposition: A | Discharge status: Home / Self Care | Payer: PPO | Source: Ambulatory Visit | Attending: Family Medicine | Admitting: Family Medicine

## 2016-10-08 DIAGNOSIS — Z1231 Encounter for screening mammogram for malignant neoplasm of breast: Secondary | ICD-10-CM

## 2016-10-30 ENCOUNTER — Encounter: Payer: Self-pay | Admitting: Family Medicine

## 2016-12-08 ENCOUNTER — Other Ambulatory Visit: Payer: Self-pay

## 2016-12-08 ENCOUNTER — Ambulatory Visit (INDEPENDENT_AMBULATORY_CARE_PROVIDER_SITE_OTHER): Payer: PPO | Admitting: Family Medicine

## 2016-12-08 ENCOUNTER — Encounter: Payer: Self-pay | Admitting: Family Medicine

## 2016-12-08 VITALS — BP 122/76 | HR 70 | Temp 98.3°F | Ht 61.0 in | Wt 162.0 lb

## 2016-12-08 DIAGNOSIS — J309 Allergic rhinitis, unspecified: Secondary | ICD-10-CM

## 2016-12-08 DIAGNOSIS — K219 Gastro-esophageal reflux disease without esophagitis: Secondary | ICD-10-CM

## 2016-12-08 DIAGNOSIS — I10 Essential (primary) hypertension: Secondary | ICD-10-CM

## 2016-12-08 DIAGNOSIS — Z23 Encounter for immunization: Secondary | ICD-10-CM

## 2016-12-08 DIAGNOSIS — Z0000 Encounter for general adult medical examination without abnormal findings: Secondary | ICD-10-CM | POA: Insufficient documentation

## 2016-12-08 DIAGNOSIS — R945 Abnormal results of liver function studies: Secondary | ICD-10-CM | POA: Diagnosis not present

## 2016-12-08 LAB — BASIC METABOLIC PANEL
BUN: 28 mg/dL — AB (ref 6–23)
CALCIUM: 9.8 mg/dL (ref 8.4–10.5)
CO2: 31 mEq/L (ref 19–32)
Chloride: 102 mEq/L (ref 96–112)
Creatinine, Ser: 1.24 mg/dL — ABNORMAL HIGH (ref 0.40–1.20)
GFR: 45.45 mL/min — AB (ref >60.00)
Glucose, Bld: 90 mg/dL (ref 70–99)
POTASSIUM: 4 meq/L (ref 3.5–5.1)
Sodium: 142 mEq/L (ref 135–145)

## 2016-12-08 LAB — LIPID PANEL
CHOLESTEROL: 231 mg/dL — AB (ref 0–200)
HDL: 65.6 mg/dL (ref >39.00)
LDL Cholesterol: 145 mg/dL — ABNORMAL HIGH (ref 0–99)
NONHDL: 165.78
Total CHOL/HDL Ratio: 4
Triglycerides: 104 mg/dL (ref 0.0–149.0)
VLDL: 20.8 mg/dL (ref 0.0–40.0)

## 2016-12-08 LAB — CBC WITH DIFFERENTIAL/PLATELET
BASOS ABS: 0.1 10*3/uL (ref 0.0–0.1)
Basophils Relative: 1.4 % (ref 0.0–3.0)
Eosinophils Absolute: 0.2 10*3/uL (ref 0.0–0.7)
Eosinophils Relative: 3.9 % (ref 0.0–5.0)
HCT: 41.4 % (ref 36.0–46.0)
Hemoglobin: 13.6 g/dL (ref 12.0–15.0)
LYMPHS ABS: 1.8 10*3/uL (ref 0.7–4.0)
Lymphocytes Relative: 28.9 % (ref 12.0–46.0)
MCHC: 32.9 g/dL (ref 30.0–36.0)
MCV: 93.6 fl (ref 78.0–100.0)
MONO ABS: 0.5 10*3/uL (ref 0.1–1.0)
Monocytes Relative: 7.6 % (ref 3.0–12.0)
NEUTROS PCT: 58.2 % (ref 43.0–77.0)
Neutro Abs: 3.6 10*3/uL (ref 1.4–7.7)
PLATELETS: 272 10*3/uL (ref 150.0–400.0)
RBC: 4.42 Mil/uL (ref 3.87–5.11)
RDW: 13.2 % (ref 11.5–15.5)
WBC: 6.2 10*3/uL (ref 4.0–10.5)

## 2016-12-08 LAB — HEPATIC FUNCTION PANEL
ALBUMIN: 4.2 g/dL (ref 3.5–5.2)
ALT: 28 U/L (ref 0–35)
AST: 25 U/L (ref 0–37)
Alkaline Phosphatase: 73 U/L (ref 39–117)
Bilirubin, Direct: 0.1 mg/dL (ref 0.0–0.3)
Total Bilirubin: 0.4 mg/dL (ref 0.2–1.2)
Total Protein: 6.9 g/dL (ref 6.0–8.3)

## 2016-12-08 LAB — POCT URINALYSIS DIPSTICK
Bilirubin, UA: NEGATIVE
Glucose, UA: NEGATIVE
KETONES UA: NEGATIVE
Leukocytes, UA: NEGATIVE
NITRITE UA: NEGATIVE
PH UA: 6 (ref 5.0–8.0)
PROTEIN UA: NEGATIVE
RBC UA: NEGATIVE
Spec Grav, UA: 1.025 (ref 1.010–1.025)
Urobilinogen, UA: 0.2 E.U./dL

## 2016-12-08 LAB — TSH: TSH: 1.54 u[IU]/mL (ref 0.35–4.50)

## 2016-12-08 MED ORDER — ESCITALOPRAM OXALATE 10 MG PO TABS: 15.0000 mg | ORAL_TABLET | Freq: Every day | ORAL | 4 refills | Status: DC

## 2016-12-08 MED ORDER — TRIAMTERENE-HCTZ 37.5-25 MG PO TABS: 1.0000 | ORAL_TABLET | Freq: Every day | ORAL | 3 refills | Status: DC

## 2016-12-08 MED ORDER — LORAZEPAM 0.5 MG PO TABS: ORAL_TABLET | ORAL | 1 refills | Status: DC

## 2016-12-08 MED ORDER — OMEPRAZOLE 40 MG PO CPDR: 40.0000 mg | DELAYED_RELEASE_CAPSULE | Freq: Every day | ORAL | 3 refills | Status: DC

## 2016-12-08 MED ORDER — MONTELUKAST SODIUM 10 MG PO TABS: 10.0000 mg | ORAL_TABLET | Freq: Every day | ORAL | 3 refills | Status: DC

## 2016-12-08 NOTE — Patient Instructions (Signed)
Begin a walking program 30 minutes daily  Healthcare power of attorney and living well  Call your insurance company about the new shingles vaccine  Labs today.......... I will call you if there is anything abnormal  Continue current medications  Follow-up in one year sooner if any problems

## 2016-12-08 NOTE — Progress Notes (Signed)
Joan Mahoney is a 71 year old married female nonsmoker who comes in today for general physical examination because of a history of mild depression, allergic rhinitis, reflux esophagitis, mild hypertension, and sleep dysfunction  She takes Lexapro 10 mg dose 15 mg daily because of history of mild depression. She wishes to continue that  She takes Ativan 0.5 once or twice weekly when she has sleep dysfunction.  She takes Claritin and Singulair for allergic rhinitis  She takes Prilosec 40 mg a day for chronic reflux esophagitis  He takes Maxide 25 daily for mild hypertension BP today normal at 122/76  She gets routine eye care, dental care, BSE monthly, and you mammography, colonoscopy 2011 normal.  She went see her ophthalmologist last year because she had a flash of dots in her right eye. She's had a history of migraine headaches. Her retinal exam was normal. These were probably ophthalmic migraines.  She had a TAH and BSO many years ago for nonmalignant reasons. Therefore pelvic exam not indicated.  Vaccinations seasonal flu shot given today information given on the new shingles vaccine.  Cognitive function normal she does not exercise on a regular basis.......... advised to start a walking program 30 minutes daily....Marland KitchenMarland Kitchen home health safety reviewed no issues identified, no guns in the house, she does have not have a healthcare power of attorney nor living well. Advised to do both this fall.  She's been retired now for 3 years. She lives here in [place] she spends a lot of time with her grandchildren 52 point review of systems review otherwise negative except for above  BP 122/76 (BP Location: Left Arm, Patient Position: Sitting, Cuff Size: Normal)   Pulse 70   Temp 98.3 F (36.8 C) (Oral)   Ht 5\' 1"  (1.549 m)   Wt 162 lb (73.5 kg)   BMI 30.61 kg/m  she's a well-developed well-nourished female no acute distress examination of the HEENT were negative except she wears contacts. Neck was  supple thyroid is not enlarged no carotid bruits. Cardiopulmonary exam normal breast exam normal abdominal exam normal pelvic and rectal not indicated extremities normal skin normal peripheral pulses normal  #1 mild hypertension..... Continue Maxide one daily  #2 allergic rhinitis......... continue Claritin and Singulair  #3 history of reflux esophagitis..... Continue Prilosec  #4 history of mild depression....... continue Lexapro 15 mg daily  #5 overweight............Marland Kitchen begin a diet exercise program.

## 2016-12-22 DIAGNOSIS — H43811 Vitreous degeneration, right eye: Secondary | ICD-10-CM | POA: Diagnosis not present

## 2016-12-22 DIAGNOSIS — D3131 Benign neoplasm of right choroid: Secondary | ICD-10-CM | POA: Diagnosis not present

## 2017-04-29 DIAGNOSIS — L819 Disorder of pigmentation, unspecified: Secondary | ICD-10-CM | POA: Diagnosis not present

## 2017-04-29 DIAGNOSIS — D229 Melanocytic nevi, unspecified: Secondary | ICD-10-CM | POA: Diagnosis not present

## 2017-04-29 DIAGNOSIS — L814 Other melanin hyperpigmentation: Secondary | ICD-10-CM | POA: Diagnosis not present

## 2017-04-29 DIAGNOSIS — L821 Other seborrheic keratosis: Secondary | ICD-10-CM | POA: Diagnosis not present

## 2017-06-22 DIAGNOSIS — H43811 Vitreous degeneration, right eye: Secondary | ICD-10-CM | POA: Diagnosis not present

## 2017-06-22 DIAGNOSIS — D3131 Benign neoplasm of right choroid: Secondary | ICD-10-CM | POA: Diagnosis not present

## 2017-07-29 ENCOUNTER — Other Ambulatory Visit: Payer: Self-pay | Admitting: Family Medicine

## 2017-08-02 ENCOUNTER — Other Ambulatory Visit: Payer: Self-pay | Admitting: Internal Medicine

## 2017-08-02 MED ORDER — LORAZEPAM 0.5 MG PO TABS
ORAL_TABLET | ORAL | 0 refills | Status: DC
Start: 2017-08-02 — End: 2017-10-14

## 2017-08-02 NOTE — Telephone Encounter (Signed)
This is Dr Sherren Mocha pt, LOV was 12/08/2017 and last refill was 12/08/2017 for 100 tablets with 1 refill. Please Advise

## 2017-09-14 DIAGNOSIS — M25512 Pain in left shoulder: Secondary | ICD-10-CM | POA: Diagnosis not present

## 2017-10-12 DIAGNOSIS — M25512 Pain in left shoulder: Secondary | ICD-10-CM | POA: Diagnosis not present

## 2017-10-14 ENCOUNTER — Other Ambulatory Visit: Payer: Self-pay | Admitting: Internal Medicine

## 2017-10-14 NOTE — Telephone Encounter (Signed)
Dr. Honor Junes Pt.

## 2017-10-15 ENCOUNTER — Encounter: Payer: Self-pay | Admitting: Family Medicine

## 2017-10-18 NOTE — Telephone Encounter (Signed)
Dr.Todds Pt

## 2017-10-18 NOTE — Telephone Encounter (Signed)
Dr. Sherren Mocha, please advise on refill of the lorazepam.  Thanks

## 2017-11-10 ENCOUNTER — Encounter: Payer: PPO | Admitting: Family Medicine

## 2017-12-07 DIAGNOSIS — M25511 Pain in right shoulder: Secondary | ICD-10-CM | POA: Diagnosis not present

## 2017-12-13 ENCOUNTER — Ambulatory Visit (INDEPENDENT_AMBULATORY_CARE_PROVIDER_SITE_OTHER): Payer: PPO | Admitting: Family Medicine

## 2017-12-13 ENCOUNTER — Encounter: Payer: Self-pay | Admitting: Family Medicine

## 2017-12-13 VITALS — BP 124/80 | HR 70 | Temp 98.5°F | Ht 61.5 in | Wt 164.6 lb

## 2017-12-13 DIAGNOSIS — Z0000 Encounter for general adult medical examination without abnormal findings: Secondary | ICD-10-CM

## 2017-12-13 DIAGNOSIS — G4700 Insomnia, unspecified: Secondary | ICD-10-CM | POA: Diagnosis not present

## 2017-12-13 DIAGNOSIS — I10 Essential (primary) hypertension: Secondary | ICD-10-CM | POA: Diagnosis not present

## 2017-12-13 DIAGNOSIS — N289 Disorder of kidney and ureter, unspecified: Secondary | ICD-10-CM

## 2017-12-13 DIAGNOSIS — J309 Allergic rhinitis, unspecified: Secondary | ICD-10-CM | POA: Diagnosis not present

## 2017-12-13 DIAGNOSIS — K219 Gastro-esophageal reflux disease without esophagitis: Secondary | ICD-10-CM | POA: Diagnosis not present

## 2017-12-13 LAB — HEPATIC FUNCTION PANEL
ALT: 26 U/L (ref 0–35)
AST: 22 U/L (ref 0–37)
Albumin: 4.5 g/dL (ref 3.5–5.2)
Alkaline Phosphatase: 78 U/L (ref 39–117)
Bilirubin, Direct: 0.1 mg/dL (ref 0.0–0.3)
Total Bilirubin: 0.6 mg/dL (ref 0.2–1.2)
Total Protein: 7.4 g/dL (ref 6.0–8.3)

## 2017-12-13 LAB — CBC WITH DIFFERENTIAL/PLATELET
BASOS PCT: 1.8 % (ref 0.0–3.0)
Basophils Absolute: 0.1 10*3/uL (ref 0.0–0.1)
EOS PCT: 2.5 % (ref 0.0–5.0)
Eosinophils Absolute: 0.2 10*3/uL (ref 0.0–0.7)
HCT: 42.7 % (ref 36.0–46.0)
Hemoglobin: 14.4 g/dL (ref 12.0–15.0)
LYMPHS ABS: 1.9 10*3/uL (ref 0.7–4.0)
Lymphocytes Relative: 23.2 % (ref 12.0–46.0)
MCHC: 33.8 g/dL (ref 30.0–36.0)
MCV: 91.8 fl (ref 78.0–100.0)
MONOS PCT: 5.6 % (ref 3.0–12.0)
Monocytes Absolute: 0.4 10*3/uL (ref 0.1–1.0)
Neutro Abs: 5.3 10*3/uL (ref 1.4–7.7)
Neutrophils Relative %: 66.9 % (ref 43.0–77.0)
Platelets: 260 10*3/uL (ref 150.0–400.0)
RBC: 4.65 Mil/uL (ref 3.87–5.11)
RDW: 13 % (ref 11.5–15.5)
WBC: 8 10*3/uL (ref 4.0–10.5)

## 2017-12-13 LAB — POCT URINALYSIS DIPSTICK
BILIRUBIN UA: NEGATIVE
GLUCOSE UA: NEGATIVE
KETONES UA: NEGATIVE
Leukocytes, UA: NEGATIVE
Nitrite, UA: NEGATIVE
Odor: NEGATIVE
Protein, UA: NEGATIVE
RBC UA: NEGATIVE
Spec Grav, UA: 1.025 (ref 1.010–1.025)
Urobilinogen, UA: 0.2 E.U./dL
pH, UA: 5.5 (ref 5.0–8.0)

## 2017-12-13 LAB — BASIC METABOLIC PANEL
BUN: 32 mg/dL — ABNORMAL HIGH (ref 6–23)
CALCIUM: 10.6 mg/dL — AB (ref 8.4–10.5)
CHLORIDE: 100 meq/L (ref 96–112)
CO2: 29 mEq/L (ref 19–32)
CREATININE: 1.29 mg/dL — AB (ref 0.40–1.20)
GFR: 43.3 mL/min — AB (ref >60.00)
Glucose, Bld: 86 mg/dL (ref 70–99)
Potassium: 4 mEq/L (ref 3.5–5.1)
Sodium: 139 mEq/L (ref 135–145)

## 2017-12-13 LAB — LIPID PANEL
CHOLESTEROL: 259 mg/dL — AB (ref 0–200)
HDL: 77.1 mg/dL (ref >39.00)
LDL Cholesterol: 162 mg/dL — ABNORMAL HIGH (ref 0–99)
NONHDL: 181.82
Total CHOL/HDL Ratio: 3
Triglycerides: 97 mg/dL (ref 0.0–149.0)
VLDL: 19.4 mg/dL (ref 0.0–40.0)

## 2017-12-13 LAB — TSH: TSH: 2.3 u[IU]/mL (ref 0.35–4.50)

## 2017-12-13 MED ORDER — OMEPRAZOLE 40 MG PO CPDR: 40.0000 mg | DELAYED_RELEASE_CAPSULE | Freq: Every day | ORAL | 3 refills | Status: DC

## 2017-12-13 MED ORDER — ESCITALOPRAM OXALATE 20 MG PO TABS: 20.0000 mg | ORAL_TABLET | Freq: Every day | ORAL | 3 refills | Status: DC

## 2017-12-13 MED ORDER — MONTELUKAST SODIUM 10 MG PO TABS: 10.0000 mg | ORAL_TABLET | Freq: Every day | ORAL | 3 refills | Status: DC

## 2017-12-13 MED ORDER — TRIAMTERENE-HCTZ 37.5-25 MG PO TABS: 1.0000 | ORAL_TABLET | Freq: Every day | ORAL | 3 refills | Status: DC

## 2017-12-13 MED ORDER — LORAZEPAM 0.5 MG PO TABS: ORAL_TABLET | ORAL | 3 refills | Status: DC

## 2017-12-13 NOTE — Progress Notes (Signed)
Joan Mahoney is a delightful 72 year old female non-smoker comes in today for evaluation of the following issues  She has a history of depression for which she takes Lexapro and she would like to increase the dose from 15 to 20 mg daily.  She also has a history of underlying anxiety.  She takes Ativan 0.5 nightly.  Sometimes twice daily  She has a long-standing history of allergic rhinitis for which she uses Claritin and singular daily  She has been to GI because of chest pain.  She has chronic reflux esophagitis which she takes Prilosec 40 mg daily.  She also has a history of mild hypertension.  She takes Maxide 25 daily.  BP today normal 120/84  She gets routine eye care......Marland Kitchen recently had some floaters in her right eye.  Went to see the ophthalmologist.  She was diagnosed with a hemorrhage.  No laser intervention was done.  She gets regular dental care she does BSE monthly and gets annual mammography although she is due last August.  Colonoscopy 2011 was normal  Vaccinations up-to-date seasonal flu shot given to the drugstore.  Information given on the new shingles vaccine.  Cognitive function normal she walks daily home health safety reviewed no issues identified, no guns in the house, she does not have a healthcare power of attorney nor a living will.  Advised to do that this year.  14 point review of system reviewed otherwise negative  EKG was done because history of hypertension.  EKG was normal and unchanged  Pelvic exam not indicated.  She had a uterus and ovaries removed many years ago for nonmalignant reasons..vs  BP 124/80 (BP Location: Left Arm, Patient Position: Sitting, Cuff Size: Large)   Pulse 70   Temp 98.5 F (36.9 C) (Oral)   Ht 5' 1.5" (1.562 m)   Wt 164 lb 9.6 oz (74.7 kg)   SpO2 97%   BMI 30.60 kg/m  Well-developed well-nourished female no acute distress vital signs stable she is afebrile HEENT were negative except for early bilateral cataracts.  Neck was supple  thyroid is not enlarged no carotid bruits.  Cardiopulmonary exam normal.  Breast exam normal.  Abdominal exam normal.  Pelvic and rectal not indicated.  Extremities normal skin normal peripheral pulses normal  1.  Hypertension at goal........... continue current therapy check labs  2.  History of depression......... increase Lexapro to 20 mg daily continue Ativan 0.5 nightly  3.  Allergic rhinitis........ continue Claritin and Singulair  4.  History of reflux esophagitis............ Prilosec 40 mg daily.

## 2017-12-13 NOTE — Patient Instructions (Signed)
Labs today.......Marland Kitchen we will call if there is anything abnormal  Continue current meds  Set a goal to walk 30 minutes daily E  Consult with your son about the issue of having a healthcare power of attorney and living well.  Good luck to you in the future and thank you for coming to see me all these years

## 2017-12-14 NOTE — Addendum Note (Signed)
Addended by: Gwenyth Ober R on: 12/14/2017 11:01 AM   Modules accepted: Orders

## 2017-12-30 DIAGNOSIS — H43811 Vitreous degeneration, right eye: Secondary | ICD-10-CM | POA: Diagnosis not present

## 2017-12-30 DIAGNOSIS — D3131 Benign neoplasm of right choroid: Secondary | ICD-10-CM | POA: Diagnosis not present

## 2018-01-03 ENCOUNTER — Other Ambulatory Visit (INDEPENDENT_AMBULATORY_CARE_PROVIDER_SITE_OTHER): Payer: PPO

## 2018-01-03 DIAGNOSIS — N289 Disorder of kidney and ureter, unspecified: Secondary | ICD-10-CM

## 2018-01-03 LAB — BASIC METABOLIC PANEL
BUN: 33 mg/dL — ABNORMAL HIGH (ref 6–23)
CHLORIDE: 102 meq/L (ref 96–112)
CO2: 29 meq/L (ref 19–32)
Calcium: 10.2 mg/dL (ref 8.4–10.5)
Creatinine, Ser: 1.2 mg/dL (ref 0.40–1.20)
GFR: 47.06 mL/min — ABNORMAL LOW (ref >60.00)
GLUCOSE: 97 mg/dL (ref 70–99)
Potassium: 4.2 mEq/L (ref 3.5–5.1)
Sodium: 140 mEq/L (ref 135–145)

## 2018-04-14 ENCOUNTER — Other Ambulatory Visit: Payer: Self-pay | Admitting: Family Medicine

## 2018-04-14 DIAGNOSIS — Z1231 Encounter for screening mammogram for malignant neoplasm of breast: Secondary | ICD-10-CM

## 2018-04-27 IMAGING — MG 2D DIGITAL SCREENING BILATERAL MAMMOGRAM WITH CAD AND ADJUNCT TO
8 of 14 series · 8 of 30 positions shown · non-contrast
Comparison: Previous exam(s).

CLINICAL DATA: Screening.

EXAM:
2D DIGITAL SCREENING BILATERAL MAMMOGRAM WITH CAD AND ADJUNCT TOMO

[L CC (1 of 2)]
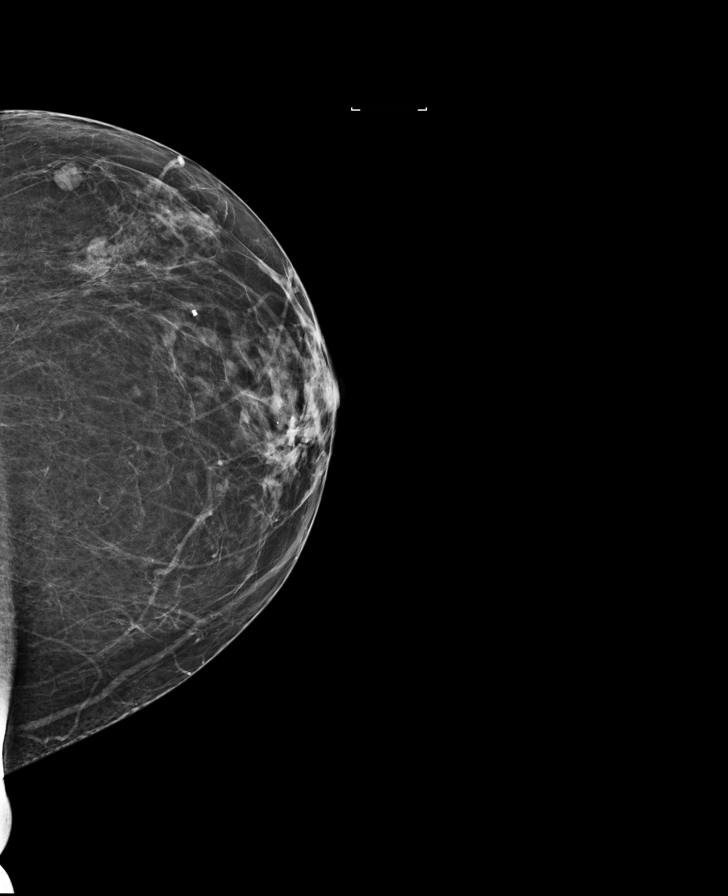

[R CC (1 of 2)]
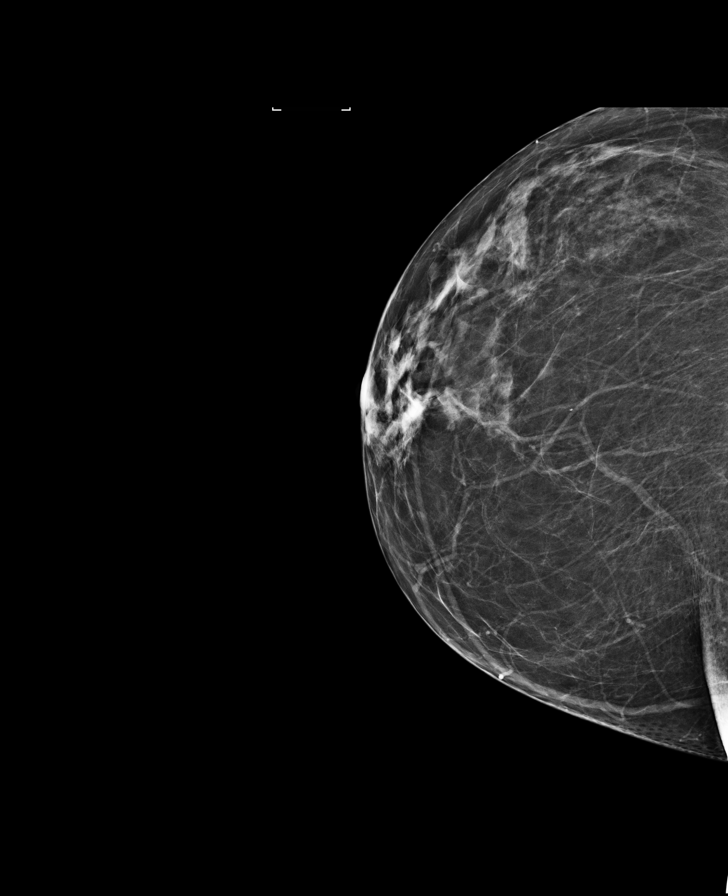

[R MLO]
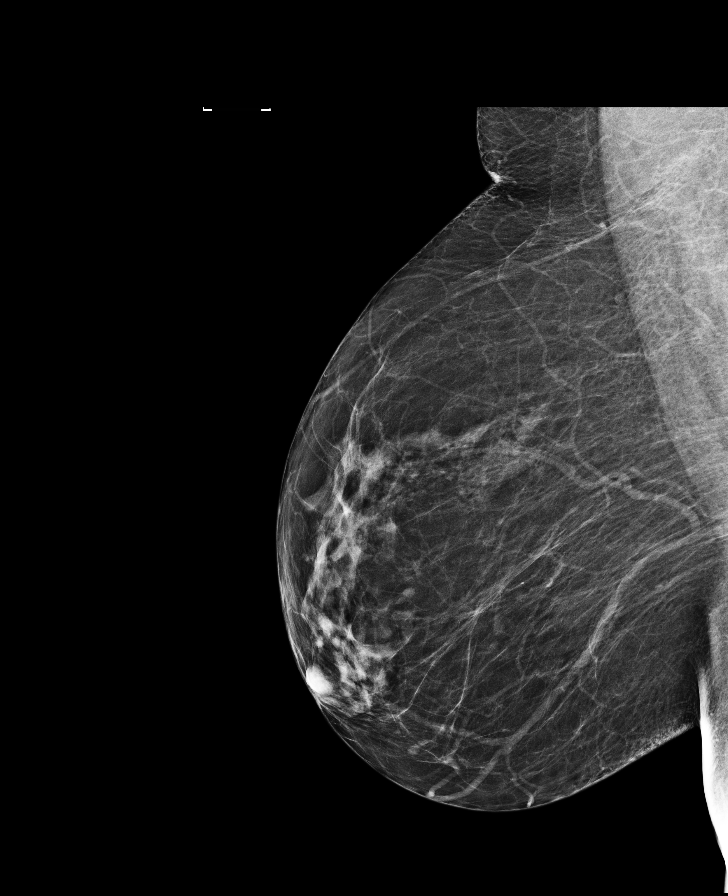

[L CC (2 of 2)]
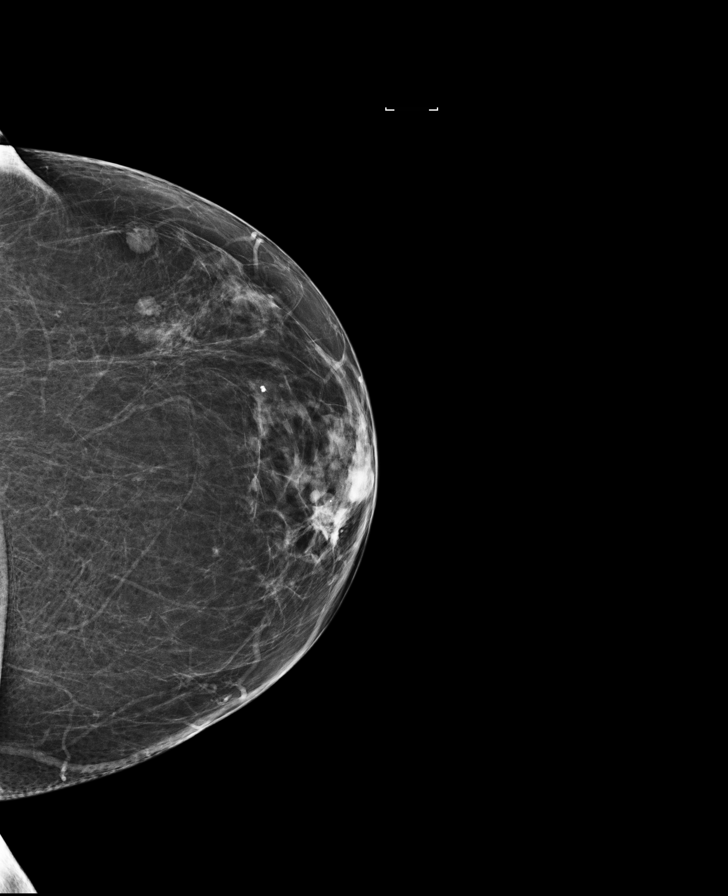

[R MLO synth-2D]
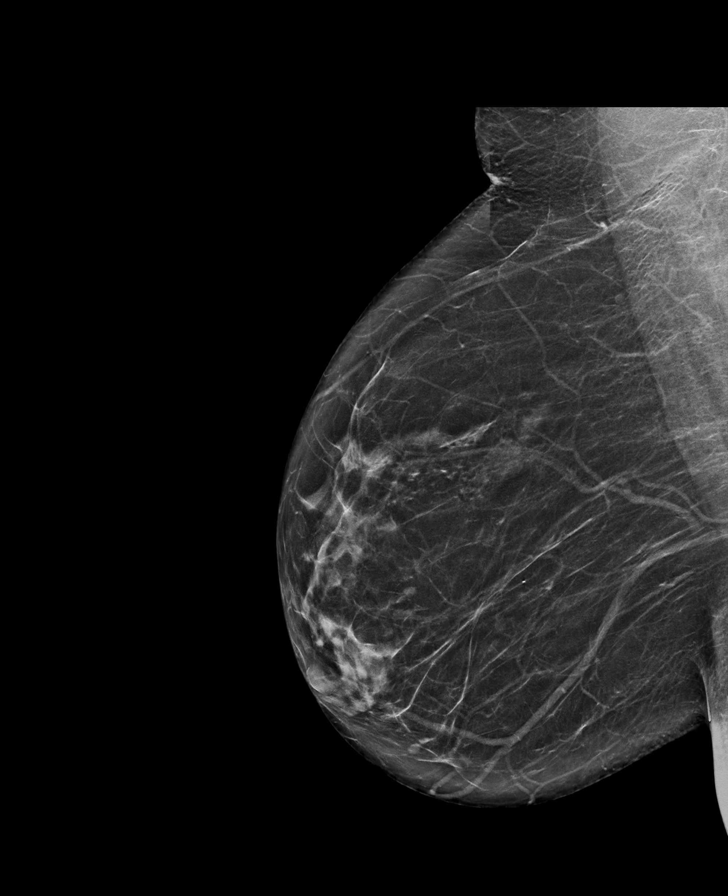

[L CC synth-2D]
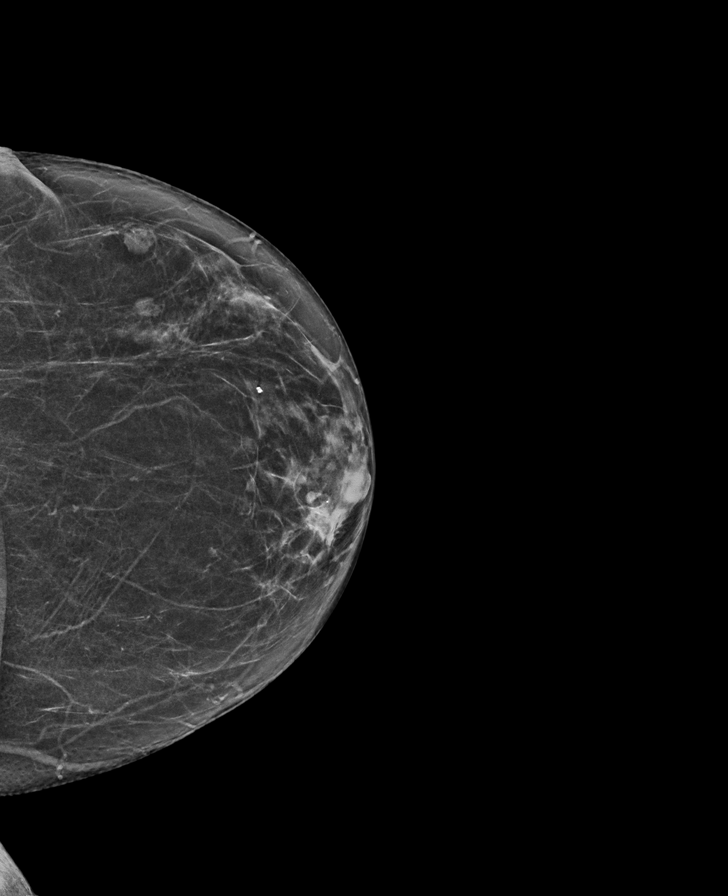

[R CC (2 of 2)]
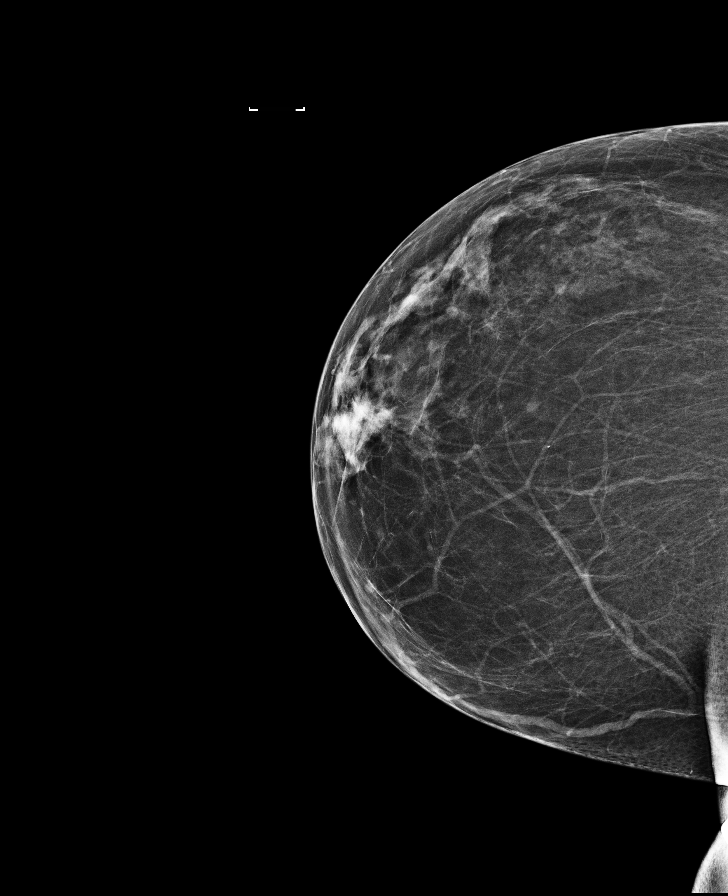

[L MLO synth-2D]
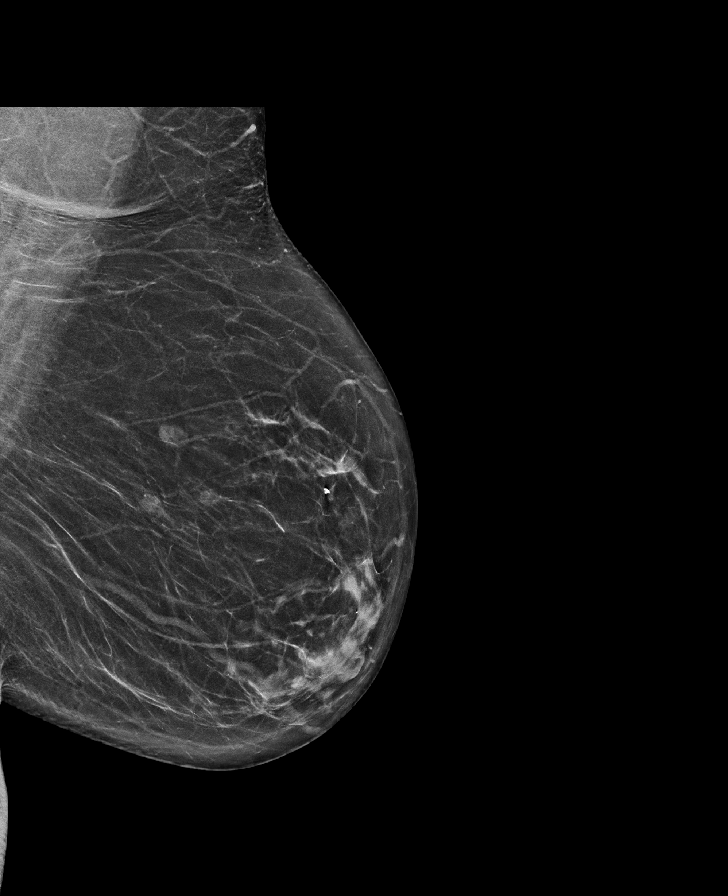

[8 of 30 positions shown; findings below may reference images not displayed]

ACR Breast Density Category b: There are scattered areas of
fibroglandular density.
FINDINGS: There are no findings suspicious for malignancy. Images were
processed with CAD.
IMPRESSION: No mammographic evidence of malignancy. A result letter of this
screening mammogram will be mailed directly to the patient.

RECOMMENDATION:
Screening mammogram in one year. (Code:97-6-RS4)

BI-RADS CATEGORY  1: Negative.

## 2018-05-02 DIAGNOSIS — D225 Melanocytic nevi of trunk: Secondary | ICD-10-CM | POA: Diagnosis not present

## 2018-05-02 DIAGNOSIS — L819 Disorder of pigmentation, unspecified: Secondary | ICD-10-CM | POA: Diagnosis not present

## 2018-05-02 DIAGNOSIS — D1801 Hemangioma of skin and subcutaneous tissue: Secondary | ICD-10-CM | POA: Diagnosis not present

## 2018-05-02 DIAGNOSIS — L821 Other seborrheic keratosis: Secondary | ICD-10-CM | POA: Diagnosis not present

## 2018-05-11 ENCOUNTER — Telehealth: Payer: Self-pay | Admitting: Family Medicine

## 2018-05-11 NOTE — Telephone Encounter (Signed)
Patient left voicemail message requesting a refill of Lorazepam. Pt is a former pt of Dr. Sherren Mocha and states she did have refills on current prescription but was told by the pharmacy that the medication could not be filled due to Dr. Sherren Mocha being retired. Pt can be contacted at [phone]. Last prescription written on 12/13/17 #200 with 3 refills.

## 2018-05-12 NOTE — Telephone Encounter (Signed)
Refill 30 day supply until she can re-establish with new primary.

## 2018-05-12 NOTE — Telephone Encounter (Signed)
Last refilled 12/2017 #200 for 30 day, pt takes BID  Dose, Route, Frequency: As Directed   Dispense Quantity: 200 tablet Refills: 3 Fills remaining: --        Sig: 1 p.o. twice daily   Please advise on quantity you want for 30 day supply, thanks.

## 2018-05-13 MED ORDER — LORAZEPAM 0.5 MG PO TABS: ORAL_TABLET | ORAL | 0 refills | Status: DC

## 2018-05-13 NOTE — Telephone Encounter (Addendum)
Rx refilled #60  Pt aware via detailed voicemail.  Nothing further needed.

## 2018-05-13 NOTE — Addendum Note (Signed)
Addended by: Virl Cagey on: 05/13/2018 03:40 PM   Modules accepted: Orders

## 2018-05-13 NOTE — Telephone Encounter (Signed)
60

## 2018-05-13 NOTE — Telephone Encounter (Signed)
Pt called about medication stating pharmacy hasnt received it/ advised Pt it was being worked on.

## 2018-05-13 NOTE — Telephone Encounter (Signed)
Pharmacy is closed - will re-open at 2pm today.  Will call back

## 2018-05-13 NOTE — Addendum Note (Signed)
Addended by: Virl Cagey on: 05/13/2018 01:55 PM   Modules accepted: Orders

## 2018-05-18 ENCOUNTER — Telehealth: Payer: Self-pay | Admitting: *Deleted

## 2018-05-18 NOTE — Telephone Encounter (Signed)
Patient requests a transfer to Dr Anitra Lauth.

## 2018-05-18 NOTE — Telephone Encounter (Signed)
OK with me.

## 2018-05-18 NOTE — Telephone Encounter (Signed)
Left message on machine for patient to return our call Will need TOC for further refills CRM

## 2018-05-18 NOTE — Telephone Encounter (Signed)
Pt has been scheduled for next week.

## 2018-05-23 ENCOUNTER — Other Ambulatory Visit: Payer: Self-pay

## 2018-05-23 ENCOUNTER — Encounter: Payer: Self-pay | Admitting: Family Medicine

## 2018-05-23 ENCOUNTER — Ambulatory Visit (INDEPENDENT_AMBULATORY_CARE_PROVIDER_SITE_OTHER): Payer: PPO | Admitting: Family Medicine

## 2018-05-23 VITALS — BP 153/83 | HR 92 | Temp 98.1°F | Wt 171.0 lb

## 2018-05-23 DIAGNOSIS — I10 Essential (primary) hypertension: Secondary | ICD-10-CM

## 2018-05-23 DIAGNOSIS — G4733 Obstructive sleep apnea (adult) (pediatric): Secondary | ICD-10-CM | POA: Diagnosis not present

## 2018-05-23 DIAGNOSIS — N183 Chronic kidney disease, stage 3 unspecified: Secondary | ICD-10-CM

## 2018-05-23 DIAGNOSIS — E7800 Pure hypercholesterolemia, unspecified: Secondary | ICD-10-CM

## 2018-05-23 DIAGNOSIS — F32A Depression, unspecified: Secondary | ICD-10-CM

## 2018-05-23 DIAGNOSIS — F419 Anxiety disorder, unspecified: Secondary | ICD-10-CM | POA: Diagnosis not present

## 2018-05-23 DIAGNOSIS — Z86018 Personal history of other benign neoplasm: Secondary | ICD-10-CM

## 2018-05-23 DIAGNOSIS — F329 Major depressive disorder, single episode, unspecified: Secondary | ICD-10-CM | POA: Diagnosis not present

## 2018-05-23 MED ORDER — METOPROLOL SUCCINATE ER 50 MG PO TB24: 50.0000 mg | ORAL_TABLET | Freq: Every day | ORAL | 0 refills | Status: DC

## 2018-05-23 NOTE — Progress Notes (Signed)
Virtual Visit via Video Note This patient is transferring her care to me from Dr. Sherren Mocha, who has retired. I reviewed her chart in EPIC/HL EMR today prior to/during the visit.  I connected with pt  on 05/23/18 at  1:30 PM EDT by a video enabled telemedicine application and verified that I am speaking with the correct person using two identifiers.  Location patient: home Location provider:work office Persons participating in the virtual visit: patient, myself.  I discussed the limitations of evaluation and management by telemedicine and the availability of in person appointments. The patient expressed understanding and agreed to proceed.  Telemedicine visit is a necessity given the COVID-19 restrictions in place at the current time.  HPI: 73 y/o WF whom I am seeing today for the first time---virtual visit. She last saw her prior PCP 12/2017 for general exam and labs and all was stable except her sCr was a little up from her baseline.  She was instructed to change maxzide to 3 days per week and the recheck of her sCr went down to near baseline. She asks about her GFR today, needed some explanation of what it is and why she has it, etc.  Last few months bp up: she quotes lots of numbers.  March--Highest 176/90, 140s-160s o/w. April a little better 153/83.  Pulse ranges 70s mostly, sometimes up She does not exercise.  Not very active outside the home. She does not limit Na. No swelling in lower legs.  Anxiety: takes lorazepam usually hs, lately bid b/c of being in home so much with quarantine. Taking lexapro daily.  She says she was on statin at one point but when her sister was dx'd with cirrhosis from NASH, Dr. Sherren Mocha took pt off her statin.  OSA: was on CPAP but says she had mask issues and she can't recall any attempts to resolve them, was years ago when she had her sleep study.    Pt reports hx of acoustic neuroma resection by Dr. Darrold Span at [facility].  She gets f/u q 5 yrs "b/c he said he  couldn't quite get it all out" and gets repeat MRI brain.  She requests referral to local ENT to see instead of going to [facility].  ROS: no CP, no SOB, no wheezing, no cough, no dizziness, no HAs, no rashes, no melena/hematochezia.  No polyuria or polydipsia.  No myalgias or arthralgias. +morning HAs fairly often-->go away by mid morning. +excessive daytime sleepiness.   Past Medical History:  Diagnosis Date  . Allergic rhinitis   . Anxiety and depression   . Chronic renal insufficiency, stage 3 (moderate) (HCC)    baseline GFR mid 40s as of 2020  . GERD (gastroesophageal reflux disease)    + hx of reflux esophagitis  . Hepatic steatosis   . History of acoustic neuroma 1993   left; resected by Dr. Darrold Span at [facility]  . Hyperlipidemia    statin in the past, but prior pcp d/c'd it b/c pt's sister was dx'd with cirrhosis from NASH.  Marland Kitchen Hypertension   . Obstructive sleep apnea    remote past--intol of CPAP->?effort? to get assistance.  Referred for re-eval 05/2018.    Past Surgical History:  Procedure Laterality Date  . ACOUSTIC NEUROMA RESECTION Left 1993    ([facility]). Chronic L ear hearing loss.  Marland Kitchen BREAST EXCISIONAL BIOPSY Left 1980s  . COLONOSCOPY  2000; 06/18/2009   Normal--recall 10 yrs (Dr. Deatra Ina).    Family History  Problem Relation Age of Onset  . Breast  cancer Cousin   . Diabetes type II Sister   . Cirrhosis Sister        Non-alcoholic    SOCIAL HX: married, I see her husband.  Never smoker.   Current Outpatient Medications:  .  aspirin 81 MG tablet, Take 81 mg by mouth daily.  , Disp: , Rfl:  .  escitalopram (LEXAPRO) 20 MG tablet, Take 1 tablet (20 mg total) by mouth daily., Disp: 100 tablet, Rfl: 3 .  loratadine (CLARITIN) 10 MG tablet, Take 10 mg by mouth daily as needed. , Disp: , Rfl:  .  LORazepam (ATIVAN) 0.5 MG tablet, 1 p.o. twice daily, Disp: 60 tablet, Rfl: 0 .  meloxicam (MOBIC) 7.5 MG tablet, Take 7.5 mg by mouth daily., Disp: , Rfl: 1 .  Misc Natural  Products (MEGA ENERGY PO), Take by mouth., Disp: , Rfl:  .  Multiple Vitamins-Minerals (EMERGEN-C FIVE PO), Take by mouth., Disp: , Rfl:  .  omeprazole (PRILOSEC) 40 MG capsule, Take 1 capsule (40 mg total) by mouth daily., Disp: 90 capsule, Rfl: 3 .  OVER THE COUNTER MEDICATION, Take 1 capsule by mouth daily. 1 Tumeric capsule daily, Disp: , Rfl:  .  triamterene-hydrochlorothiazide (MAXZIDE-25) 37.5-25 MG tablet, Take 1 tablet by mouth daily., Disp: 100 tablet, Rfl: 3 .  metoprolol succinate (TOPROL-XL) 50 MG 24 hr tablet, Take 1 tablet (50 mg total) by mouth daily. Take with or immediately following a meal., Disp: 30 tablet, Rfl: 0 .  montelukast (SINGULAIR) 10 MG tablet, Take 1 tablet (10 mg total) by mouth at bedtime. (Patient not taking: Reported on 05/23/2018), Disp: 90 tablet, Rfl: 3 .  OVER THE COUNTER MEDICATION, Focus factor, Disp: , Rfl:   EXAM:  VITALS per patient if applicable: BP (!) 720/94 (BP Location: Left Arm, Patient Position: Sitting, Cuff Size: Large)   Pulse 92   Temp 98.1 F (36.7 C) (Oral)   Wt 171 lb (77.6 kg)   BMI 31.79 kg/m    GENERAL: alert, oriented, appears well and in no acute distress  HEENT: atraumatic, conjunttiva clear, no obvious abnormalities on inspection of external nose and ears  NECK: normal movements of the head and neck  LUNGS: on inspection no signs of respiratory distress, breathing rate appears normal, no obvious gross SOB, gasping or wheezing  CV: no obvious cyanosis  MS: moves all visible extremities without noticeable abnormality  PSYCH/NEURO: pleasant and cooperative, no obvious depression or anxiety, speech and thought processing grossly intact  LAB: none today Lab Results  Component Value Date   TSH 2.30 12/13/2017   Lab Results  Component Value Date   WBC 8.0 12/13/2017   HGB 14.4 12/13/2017   HCT 42.7 12/13/2017   MCV 91.8 12/13/2017   PLT 260.0 12/13/2017   Lab Results  Component Value Date   CREATININE 1.20  01/03/2018   BUN 33 (H) 01/03/2018   NA 140 01/03/2018   K 4.2 01/03/2018   CL 102 01/03/2018   CO2 29 01/03/2018   Lab Results  Component Value Date   ALT 26 12/13/2017   AST 22 12/13/2017   ALKPHOS 78 12/13/2017   BILITOT 0.6 12/13/2017   Lab Results  Component Value Date   CHOL 259 (H) 12/13/2017   Lab Results  Component Value Date   HDL 77.10 12/13/2017   Lab Results  Component Value Date   LDLCALC 162 (H) 12/13/2017   Lab Results  Component Value Date   TRIG 97.0 12/13/2017   Lab Results  Component Value Date   CHOLHDL 3 12/13/2017   Lab Results  Component Value Date   HGBA1C 5.7 06/08/2016   ASSESSMENT AND PLAN:  Discussed the following assessment and plan:  Transfer pt from Dr. Sherren Mocha.  1) Uncontrolled HTN: add toprol xl 50mg  qd and continue maxzide M/W/F. Needs to get much more active and focus more on avoiding sodium in diet. Continue bp and HR monitoring and we'll review them at virtual visit f/u in 2 wks.  2) CRI III: discussed this today.  Suspect medical renal dz from aging + hypertension +/- NSAIDs. GFR baseline seems to be mid 40s. BMET ordered-future--to monitor this. Discussed need to avoid regular use of NSAIDs and to focus on staying well hydrated.  3) OSA: not on CPAP.  With her frequent morning HAs, her poorly controlled HTN, and he excessive daytime sleepiness, I recommended she see a sleep specialist and get re-eval for this problem.  She was agreeable to referral to Dr. Rexene Alberts in neurology.  4) Chronic anxiety, hx of depression: seems to be stable on current regimen of lexapro 20mg  qd and qd-bid lorazepam 0.5mg  dose.  Notified pt that at their next f/u appt in our office we'll have to have them sign a controlled substances contract as per the new standards set by [facility] medical board.  UDS at that time as well. No new rx's needed for lexapro or lorazepam today.  5) Hx of acoustic neuroma: pt requests referral to local ENT to get f/u for  this. Will order referral today.   I discussed the assessment and treatment plan with the patient. The patient was provided an opportunity to ask questions and all were answered. The patient agreed with the plan and demonstrated an understanding of the instructions.   The patient was advised to call back or seek an in-person evaluation if the symptoms worsen or if the condition fails to improve as anticipated.  F/u: Lab visit for BMET ASAP, and 2 week f/u HTN.  Signed:  Crissie Sickles, MD           05/23/2018

## 2018-05-25 ENCOUNTER — Other Ambulatory Visit (INDEPENDENT_AMBULATORY_CARE_PROVIDER_SITE_OTHER): Payer: PPO

## 2018-05-25 DIAGNOSIS — N183 Chronic kidney disease, stage 3 unspecified: Secondary | ICD-10-CM

## 2018-05-25 DIAGNOSIS — Z5111 Encounter for antineoplastic chemotherapy: Secondary | ICD-10-CM | POA: Diagnosis not present

## 2018-05-25 DIAGNOSIS — C787 Secondary malignant neoplasm of liver and intrahepatic bile duct: Secondary | ICD-10-CM | POA: Diagnosis not present

## 2018-05-25 DIAGNOSIS — C184 Malignant neoplasm of transverse colon: Secondary | ICD-10-CM | POA: Diagnosis not present

## 2018-05-25 DIAGNOSIS — E119 Type 2 diabetes mellitus without complications: Secondary | ICD-10-CM | POA: Diagnosis not present

## 2018-05-25 DIAGNOSIS — I10 Essential (primary) hypertension: Secondary | ICD-10-CM

## 2018-05-25 LAB — BASIC METABOLIC PANEL
BUN: 21 mg/dL (ref 6–23)
CO2: 30 mEq/L (ref 19–32)
Calcium: 9.6 mg/dL (ref 8.4–10.5)
Chloride: 102 mEq/L (ref 96–112)
Creatinine, Ser: 0.96 mg/dL (ref 0.40–1.20)
GFR: 57.22 mL/min — ABNORMAL LOW (ref >60.00)
Glucose, Bld: 78 mg/dL (ref 70–99)
Potassium: 4.2 mEq/L (ref 3.5–5.1)
Sodium: 139 mEq/L (ref 135–145)

## 2018-06-07 ENCOUNTER — Ambulatory Visit (INDEPENDENT_AMBULATORY_CARE_PROVIDER_SITE_OTHER): Payer: PPO | Admitting: Family Medicine

## 2018-06-07 ENCOUNTER — Other Ambulatory Visit: Payer: Self-pay

## 2018-06-07 ENCOUNTER — Encounter: Payer: Self-pay | Admitting: Family Medicine

## 2018-06-07 VITALS — BP 131/79 | HR 73

## 2018-06-07 DIAGNOSIS — N183 Chronic kidney disease, stage 3 unspecified: Secondary | ICD-10-CM

## 2018-06-07 DIAGNOSIS — I10 Essential (primary) hypertension: Secondary | ICD-10-CM

## 2018-06-07 DIAGNOSIS — K219 Gastro-esophageal reflux disease without esophagitis: Secondary | ICD-10-CM | POA: Diagnosis not present

## 2018-06-07 MED ORDER — METOPROLOL SUCCINATE ER 50 MG PO TB24: 50.0000 mg | ORAL_TABLET | Freq: Every day | ORAL | 1 refills | Status: DC

## 2018-06-07 NOTE — Progress Notes (Signed)
Virtual Visit via Video Note  I connected with pt on 06/07/18 at 10:20 AM EDT by a video enabled telemedicine application and verified that I am speaking with the correct person using two identifiers.  Location patient: home Location provider:work or home office Persons participating in the virtual visit: patient, provider  I discussed the limitations of evaluation and management by telemedicine and the availability of in person appointments. The patient expressed understanding and agreed to proceed.   HPI: 73 y/o WF being seen for 2 week f/u HTN. Last visit (to establish care) I added toprol xl 50mg  qd to her maxzide that she takes M/W/F.  Labs from 05/25/18 showed normal electrolytes and her kidney function was improved compared to the prior 3 checks.  Her baseline GFR is the mid 40s.  She feels much better!  HA's gone. Home bp's normal now, HR into 60s now. No side effects from toprol.   She is very pleased with results.  She asks about cutting back on her prilosec b/c of worry about renal damage from PPIs. She has started taking this qod and does still feel GER on the days she doesn't take it. She takes tums/alka seltzer on the off PPI days.  ROS: no CP, no SOB, no wheezing, no cough, no dizziness, no HAs, no rashes, no melena/hematochezia.  No polyuria or polydipsia.  No myalgias or arthralgias.   Past Medical History:  Diagnosis Date  . Allergic rhinitis   . Anxiety and depression   . Chronic renal insufficiency, stage 3 (moderate) (HCC)    baseline GFR mid 40s as of 2020  . GERD (gastroesophageal reflux disease)    + hx of reflux esophagitis  . Hepatic steatosis   . History of acoustic neuroma 1993   left; resected by Dr. Darrold Span at [facility]  . Hyperlipidemia    statin in the past, but prior pcp d/c'd it b/c pt's sister was dx'd with cirrhosis from NASH.  Marland Kitchen Hypertension   . Obstructive sleep apnea    remote past--intol of CPAP->?effort? to get assistance.  Referred for  re-eval 05/2018.    Past Surgical History:  Procedure Laterality Date  . ACOUSTIC NEUROMA RESECTION Left 1993    ([facility]). Chronic L ear hearing loss.  Marland Kitchen BREAST EXCISIONAL BIOPSY Left 1980s  . COLONOSCOPY  2000; 06/18/2009   Normal--recall 10 yrs (Dr. Deatra Ina).    Family History  Problem Relation Age of Onset  . Breast cancer Cousin   . Diabetes type II Sister   . Cirrhosis Sister        Non-alcoholic   Soc hx: no tob/alc.   Current Outpatient Medications:  .  aspirin 81 MG tablet, Take 81 mg by mouth daily.  , Disp: , Rfl:  .  escitalopram (LEXAPRO) 20 MG tablet, Take 1 tablet (20 mg total) by mouth daily., Disp: 100 tablet, Rfl: 3 .  loratadine (CLARITIN) 10 MG tablet, Take 10 mg by mouth daily as needed. , Disp: , Rfl:  .  LORazepam (ATIVAN) 0.5 MG tablet, 1 p.o. twice daily, Disp: 60 tablet, Rfl: 0 .  meloxicam (MOBIC) 7.5 MG tablet, Take 7.5 mg by mouth daily., Disp: , Rfl: 1 .  metoprolol succinate (TOPROL-XL) 50 MG 24 hr tablet, Take 1 tablet (50 mg total) by mouth daily. Take with or immediately following a meal., Disp: 30 tablet, Rfl: 0 .  Misc Natural Products (MEGA ENERGY PO), Take by mouth., Disp: , Rfl:  .  montelukast (SINGULAIR) 10 MG tablet, Take 1  tablet (10 mg total) by mouth at bedtime., Disp: 90 tablet, Rfl: 3 .  Multiple Vitamins-Minerals (EMERGEN-C FIVE PO), Take by mouth., Disp: , Rfl:  .  omeprazole (PRILOSEC) 40 MG capsule, Take 1 capsule (40 mg total) by mouth daily., Disp: 90 capsule, Rfl: 3 .  OVER THE COUNTER MEDICATION, Focus factor, Disp: , Rfl:  .  OVER THE COUNTER MEDICATION, Take 1 capsule by mouth daily. 1 Tumeric capsule daily, Disp: , Rfl:  .  triamterene-hydrochlorothiazide (MAXZIDE-25) 37.5-25 MG tablet, Take 1 tablet by mouth daily., Disp: 100 tablet, Rfl: 3  EXAM:  VITALS per patient if applicable: BP 973/53 (BP Location: Left Arm, Patient Position: Sitting, Cuff Size: Large)   Pulse 73   GENERAL: alert, oriented, appears well and in no  acute distress  HEENT: atraumatic, conjunttiva clear, no obvious abnormalities on inspection of external nose and ears  NECK: normal movements of the head and neck  LUNGS: on inspection no signs of respiratory distress, breathing rate appears normal, no obvious gross SOB, gasping or wheezing  CV: no obvious cyanosis  MS: moves all visible extremities without noticeable abnormality  PSYCH/NEURO: pleasant and cooperative, no obvious depression or anxiety, speech and thought processing grossly intact  LABS: none today    Chemistry      Component Value Date/Time   NA 139 05/25/2018 0904   K 4.2 05/25/2018 0904   CL 102 05/25/2018 0904   CO2 30 05/25/2018 0904   BUN 21 05/25/2018 0904   CREATININE 0.96 05/25/2018 0904      Component Value Date/Time   CALCIUM 9.6 05/25/2018 0904   ALKPHOS 78 12/13/2017 0917   AST 22 12/13/2017 0917   ALT 26 12/13/2017 0917   BILITOT 0.6 12/13/2017 0917      ASSESSMENT AND PLAN:  Discussed the following assessment and plan:  1) HTN: The current medical regimen is effective;  continue present plan and medications. Great response to toprol xl 50mg  qd. Continue this along with maxzide M/W/F. Lytes normal, sCr improved on last labs 05/25/18.  2) CRI stage III (GFR 40s): she understands the importance of good fluid intake, avoidance of regular NSAID use if possible.    3) GERD: ok to cut back prilosec gradually, as she is worried about possible renal affects of PPI's. Take otc H2 blocker on days not taking prilosec.  I discussed the assessment and treatment plan with the patient. The patient was provided an opportunity to ask questions and all were answered. The patient agreed with the plan and demonstrated an understanding of the instructions.   The patient was advised to call back or seek an in-person evaluation if the symptoms worsen or if the condition fails to improve as anticipated.  F/u: 6 mo  Signed:  Crissie Sickles, MD            06/07/2018

## 2018-07-25 ENCOUNTER — Other Ambulatory Visit: Payer: Self-pay | Admitting: Family Medicine

## 2018-07-26 ENCOUNTER — Other Ambulatory Visit: Payer: Self-pay

## 2018-07-26 ENCOUNTER — Other Ambulatory Visit: Payer: Self-pay | Admitting: Family Medicine

## 2018-07-26 ENCOUNTER — Ambulatory Visit
Admission: RE | Admit: 2018-07-26 | Discharge: 2018-07-26 | Disposition: A | Discharge status: Home / Self Care | Payer: PPO | Source: Ambulatory Visit | Attending: Cardiology | Admitting: Cardiology

## 2018-07-26 DIAGNOSIS — Z1231 Encounter for screening mammogram for malignant neoplasm of breast: Secondary | ICD-10-CM | POA: Diagnosis not present

## 2018-07-28 ENCOUNTER — Other Ambulatory Visit: Payer: Self-pay | Admitting: Family Medicine

## 2018-07-28 MED ORDER — LORAZEPAM 0.5 MG PO TABS: ORAL_TABLET | ORAL | 5 refills | Status: DC

## 2018-07-28 NOTE — Telephone Encounter (Signed)
RX REFILL: LORazepam (ATIVAN) 0.5 MG tablet  Pharmacy:  [facility] [address], [place] [address] [phone] (Phone) [phone] (Fax)  Call back # [phone]

## 2018-07-28 NOTE — Telephone Encounter (Signed)
RF request for ativan. Last OV 06/07/2018 Next OV 12/07/2018 CPE Last RF 05/13/2018 # 60 0 RF.  Please advise.

## 2018-08-23 DIAGNOSIS — I781 Nevus, non-neoplastic: Secondary | ICD-10-CM | POA: Diagnosis not present

## 2018-08-23 DIAGNOSIS — H25813 Combined forms of age-related cataract, bilateral: Secondary | ICD-10-CM | POA: Diagnosis not present

## 2018-08-23 DIAGNOSIS — H35461 Secondary vitreoretinal degeneration, right eye: Secondary | ICD-10-CM | POA: Diagnosis not present

## 2018-08-23 DIAGNOSIS — H35373 Puckering of macula, bilateral: Secondary | ICD-10-CM | POA: Diagnosis not present

## 2018-12-07 ENCOUNTER — Encounter: Payer: Self-pay | Admitting: Family Medicine

## 2018-12-07 ENCOUNTER — Other Ambulatory Visit: Payer: Self-pay

## 2018-12-07 ENCOUNTER — Ambulatory Visit (INDEPENDENT_AMBULATORY_CARE_PROVIDER_SITE_OTHER): Payer: PPO | Admitting: Family Medicine

## 2018-12-07 VITALS — BP 140/84 | HR 68 | Temp 98.0°F | Resp 16 | Ht 61.5 in | Wt 172.0 lb

## 2018-12-07 DIAGNOSIS — F411 Generalized anxiety disorder: Secondary | ICD-10-CM

## 2018-12-07 DIAGNOSIS — K760 Fatty (change of) liver, not elsewhere classified: Secondary | ICD-10-CM

## 2018-12-07 DIAGNOSIS — N183 Chronic kidney disease, stage 3 unspecified: Secondary | ICD-10-CM | POA: Diagnosis not present

## 2018-12-07 DIAGNOSIS — Z79899 Other long term (current) drug therapy: Secondary | ICD-10-CM

## 2018-12-07 DIAGNOSIS — I10 Essential (primary) hypertension: Secondary | ICD-10-CM | POA: Diagnosis not present

## 2018-12-07 DIAGNOSIS — E7800 Pure hypercholesterolemia, unspecified: Secondary | ICD-10-CM | POA: Diagnosis not present

## 2018-12-07 DIAGNOSIS — E2839 Other primary ovarian failure: Secondary | ICD-10-CM | POA: Diagnosis not present

## 2018-12-07 DIAGNOSIS — Z0000 Encounter for general adult medical examination without abnormal findings: Secondary | ICD-10-CM | POA: Diagnosis not present

## 2018-12-07 LAB — COMPREHENSIVE METABOLIC PANEL
ALT: 23 U/L (ref 0–35)
AST: 23 U/L (ref 0–37)
Albumin: 4.3 g/dL (ref 3.5–5.2)
Alkaline Phosphatase: 80 U/L (ref 39–117)
BUN: 21 mg/dL (ref 6–23)
CO2: 31 mEq/L (ref 19–32)
Calcium: 9.7 mg/dL (ref 8.4–10.5)
Chloride: 103 mEq/L (ref 96–112)
Creatinine, Ser: 0.95 mg/dL (ref 0.40–1.20)
GFR: 57.83 mL/min — ABNORMAL LOW (ref >60.00)
Glucose, Bld: 100 mg/dL — ABNORMAL HIGH (ref 70–99)
Potassium: 4.7 mEq/L (ref 3.5–5.1)
Sodium: 139 mEq/L (ref 135–145)
Total Bilirubin: 0.5 mg/dL (ref 0.2–1.2)
Total Protein: 6.9 g/dL (ref 6.0–8.3)

## 2018-12-07 LAB — CBC WITH DIFFERENTIAL/PLATELET
Basophils Absolute: 0.1 10*3/uL (ref 0.0–0.1)
Basophils Relative: 2.5 % (ref 0.0–3.0)
Eosinophils Absolute: 0.3 10*3/uL (ref 0.0–0.7)
Eosinophils Relative: 5.1 % — ABNORMAL HIGH (ref 0.0–5.0)
HCT: 41.3 % (ref 36.0–46.0)
Hemoglobin: 13.6 g/dL (ref 12.0–15.0)
Lymphocytes Relative: 29.8 % (ref 12.0–46.0)
Lymphs Abs: 1.7 10*3/uL (ref 0.7–4.0)
MCHC: 33 g/dL (ref 30.0–36.0)
MCV: 92.8 fl (ref 78.0–100.0)
Monocytes Absolute: 0.4 10*3/uL (ref 0.1–1.0)
Monocytes Relative: 7.9 % (ref 3.0–12.0)
Neutro Abs: 3.1 10*3/uL (ref 1.4–7.7)
Neutrophils Relative %: 54.7 % (ref 43.0–77.0)
Platelets: 222 10*3/uL (ref 150.0–400.0)
RBC: 4.45 Mil/uL (ref 3.87–5.11)
RDW: 13.6 % (ref 11.5–15.5)
WBC: 5.6 10*3/uL (ref 4.0–10.5)

## 2018-12-07 LAB — LIPID PANEL
Cholesterol: 260 mg/dL — ABNORMAL HIGH (ref 0–200)
HDL: 56.1 mg/dL (ref >39.00)
LDL Cholesterol: 170 mg/dL — ABNORMAL HIGH (ref 0–99)
NonHDL: 203.75
Total CHOL/HDL Ratio: 5
Triglycerides: 168 mg/dL — ABNORMAL HIGH (ref 0.0–149.0)
VLDL: 33.6 mg/dL (ref 0.0–40.0)

## 2018-12-07 MED ORDER — METOPROLOL SUCCINATE ER 50 MG PO TB24: 50.0000 mg | ORAL_TABLET | Freq: Every day | ORAL | 3 refills | Status: DC

## 2018-12-07 MED ORDER — LORAZEPAM 0.5 MG PO TABS: ORAL_TABLET | ORAL | 5 refills | Status: DC

## 2018-12-07 MED ORDER — TRIAMTERENE-HCTZ 37.5-25 MG PO TABS: 1.0000 | ORAL_TABLET | Freq: Every day | ORAL | 3 refills | Status: DC

## 2018-12-07 NOTE — Progress Notes (Signed)
Office Note 12/07/2018  CC:  Chief Complaint  Patient presents with  . Annual Exam    pt is fasting    HPI:  Joan Mahoney is a 73 y.o. White female who is here for annual health maintenance exam.  Hx of anxiety and depression: taking SSRI long term as well as lorazepam 0.5mg , 1 bid. She takes this usually only at night.  Only occ dose in daytime for increased anxiety. Most recent rx fill was 11/11/18, #60, rx by me.  No red flags. Will get CSC today.  Needs to drink more water. No exercise, not as active as she should be at all. Diet: not working any on her diet.  Past Medical History:  Diagnosis Date  . Allergic rhinitis   . Anxiety and depression   . Chronic renal insufficiency, stage 3 (moderate)    baseline GFR mid 40s as of 2020  . GERD (gastroesophageal reflux disease)    + hx of reflux esophagitis  . Hepatic steatosis   . History of acoustic neuroma 1993   left; resected by Dr. Darrold Span at [facility]  . Hyperlipidemia    statin in the past, but prior pcp d/c'd it b/c pt's sister was dx'd with cirrhosis from NASH.  Marland Kitchen Hypertension   . Obstructive sleep apnea    remote past--intol of CPAP->?effort? to get assistance.  Referred for re-eval 05/2018.    Past Surgical History:  Procedure Laterality Date  . ACOUSTIC NEUROMA RESECTION Left 1993    ([facility]). Chronic L ear hearing loss.  Marland Kitchen BREAST EXCISIONAL BIOPSY Left 1980s  . COLONOSCOPY  2000; 06/18/2009   Normal--recall 10 yrs (Dr. Deatra Ina).  . TOTAL ABDOMINAL HYSTERECTOMY W/ BILATERAL SALPINGOOPHORECTOMY  REMOTE PAST   nonmalignant reason    Family History  Problem Relation Age of Onset  . Breast cancer Cousin   . Diabetes type II Sister   . Cirrhosis Sister        Non-alcoholic    Social History   Socioeconomic History  . Marital status: Married    Spouse name: Not on file  . Number of children: Not on file  . Years of education: Not on file  . Highest education level: Not on file  Occupational  History  . Not on file  Social Needs  . Financial resource strain: Not on file  . Food insecurity    Worry: Not on file    Inability: Not on file  . Transportation needs    Medical: Not on file    Non-medical: Not on file  Tobacco Use  . Smoking status: Former Research scientist (life sciences)  . Smokeless tobacco: Never Used  Substance and Sexual Activity  . Alcohol use: Never    Frequency: Never  . Drug use: Never  . Sexual activity: Not on file  Lifestyle  . Physical activity    Days per week: Not on file    Minutes per session: Not on file  . Stress: Not on file  Relationships  . Social Herbalist on phone: Not on file    Gets together: Not on file    Attends religious service: Not on file    Active member of club or organization: Not on file    Attends meetings of clubs or organizations: Not on file    Relationship status: Not on file  . Intimate partner violence    Fear of current or ex partner: Not on file    Emotionally abused: Not on file  Physically abused: Not on file    Forced sexual activity: Not on file  Other Topics Concern  . Not on file  Social History Narrative   Married, lives in [place].   I see her husband Jeneen Rinks.   No T/A/Ds.    Outpatient Medications Prior to Visit  Medication Sig Dispense Refill  . aspirin 81 MG tablet Take 81 mg by mouth daily.      Marland Kitchen escitalopram (LEXAPRO) 20 MG tablet Take 1 tablet (20 mg total) by mouth daily. 100 tablet 3  . loratadine (CLARITIN) 10 MG tablet Take 10 mg by mouth daily as needed.     Marland Kitchen LORazepam (ATIVAN) 0.5 MG tablet 1 p.o. twice daily 60 tablet 5  . meloxicam (MOBIC) 7.5 MG tablet Take 7.5 mg by mouth daily.  1  . metoprolol succinate (TOPROL-XL) 50 MG 24 hr tablet Take 1 tablet (50 mg total) by mouth daily. Take with or immediately following a meal. 90 tablet 1  . Misc Natural Products (MEGA ENERGY PO) Take by mouth.    . montelukast (SINGULAIR) 10 MG tablet Take 1 tablet (10 mg total) by mouth at bedtime. 90  tablet 3  . Multiple Vitamins-Minerals (EMERGEN-C FIVE PO) Take by mouth.    Marland Kitchen omeprazole (PRILOSEC) 40 MG capsule Take 1 capsule (40 mg total) by mouth daily. 90 capsule 3  . OVER THE COUNTER MEDICATION Take 1 capsule by mouth daily. 1 Tumeric capsule daily    . triamcinolone cream (KENALOG) 0.1 % APPLY CREAM EXTERNALLY TO AFFECTED AREA TWICE DAILY AS NEEDED    . triamterene-hydrochlorothiazide (MAXZIDE-25) 37.5-25 MG tablet Take 1 tablet by mouth daily. 100 tablet 3  . OVER THE COUNTER MEDICATION Focus factor     No facility-administered medications prior to visit.     No Known Allergies  ROS Review of Systems  Constitutional: Negative for appetite change, chills, fatigue and fever.  HENT: Negative for congestion, dental problem, ear pain and sore throat.   Eyes: Negative for discharge, redness and visual disturbance.  Respiratory: Negative for cough, chest tightness, shortness of breath and wheezing.   Cardiovascular: Negative for chest pain, palpitations and leg swelling.  Gastrointestinal: Negative for abdominal pain, blood in stool, diarrhea, nausea and vomiting.  Genitourinary: Negative for difficulty urinating, dysuria, flank pain, frequency, hematuria and urgency.  Musculoskeletal: Negative for arthralgias, back pain, joint swelling, myalgias and neck stiffness.  Skin: Negative for pallor and rash.  Neurological: Negative for dizziness, speech difficulty, weakness and headaches.  Hematological: Negative for adenopathy. Does not bruise/bleed easily.  Psychiatric/Behavioral: Negative for confusion and sleep disturbance. The patient is not nervous/anxious.     PE; Blood pressure 140/84, pulse 68, temperature 98 F (36.7 C), temperature source Temporal, resp. rate 16, height 5' 1.5" (1.562 m), weight 172 lb (78 kg), SpO2 95 %. Body mass index is 31.97 kg/m. Exam chaperoned by Deveron Furlong, CMA.  Gen: Alert, well appearing.  Patient is oriented to person, place, time, and  situation. AFFECT: pleasant, lucid thought and speech. ENT: Ears: EACs clear, normal epithelium.  TMs with good light reflex and landmarks bilaterally.  Eyes: no injection, icteris, swelling, or exudate.  EOMI, PERRLA. Nose: no drainage or turbinate edema/swelling.  No injection or focal lesion.  Mouth: lips without lesion/swelling.  Oral mucosa pink and moist.  Dentition intact and without obvious caries or gingival swelling.  Oropharynx without erythema, exudate, or swelling.  Neck: supple/nontender.  No LAD, mass, or TM.  Carotid pulses 2+ bilaterally, without bruits. CV:  RRR, no m/r/g.   LUNGS: CTA bilat, nonlabored resps, good aeration in all lung fields. ABD: soft, NT, ND, BS normal.  No hepatospenomegaly or mass.  No bruits. EXT: no clubbing, cyanosis, or edema.  Musculoskeletal: no joint swelling, erythema, warmth, or tenderness.  ROM of all joints intact. Skin - no sores or suspicious lesions or rashes or color changes   Pertinent labs:  Lab Results  Component Value Date   TSH 2.30 12/13/2017   Lab Results  Component Value Date   WBC 8.0 12/13/2017   HGB 14.4 12/13/2017   HCT 42.7 12/13/2017   MCV 91.8 12/13/2017   PLT 260.0 12/13/2017   Lab Results  Component Value Date   CREATININE 0.96 05/25/2018   BUN 21 05/25/2018   NA 139 05/25/2018   K 4.2 05/25/2018   CL 102 05/25/2018   CO2 30 05/25/2018   Lab Results  Component Value Date   ALT 26 12/13/2017   AST 22 12/13/2017   ALKPHOS 78 12/13/2017   BILITOT 0.6 12/13/2017   Lab Results  Component Value Date   CHOL 259 (H) 12/13/2017   Lab Results  Component Value Date   HDL 77.10 12/13/2017   Lab Results  Component Value Date   LDLCALC 162 (H) 12/13/2017   Lab Results  Component Value Date   TRIG 97.0 12/13/2017   Lab Results  Component Value Date   CHOLHDL 3 12/13/2017   Lab Results  Component Value Date   HGBA1C 5.7 06/08/2016   ASSESSMENT AND PLAN:   1) GAD/high risk med use. PMP AWARE  reviewed, no red flags. CSC signed today. RF'd lorazepam 0.5mg , 1 bid prn, #60, RF x 5.  2) Health maintenance exam: Reviewed age and gender appropriate health maintenance issues (prudent diet, regular exercise, health risks of tobacco and excessive alcohol, use of seatbelts, fire alarms in home, use of sunscreen).  Also reviewed age and gender appropriate health screening as well as vaccine recommendations. Vaccines: flu vaccine->has already had this.  Shingrix ->she'll check with insurer first. Labs: fasting HP ordered. Cervical ca screening: remote hx of TAH/BSO->not candidate for cerv ca screening. Breast ca screening: mammo 07/26/18 normal. Rpt 1 yr. Colon ca screening: recall 06/2019 [facility]). Osteoporosis screening: DEXA->she declines DEXA.  An After Visit Summary was printed and given to the patient.  FOLLOW UP:  Return in about 6 months (around 06/07/2019) for routine chronic illness f/u.  Signed:  Crissie Sickles, MD           12/07/2018

## 2018-12-07 NOTE — Patient Instructions (Addendum)
Check with your insurer about coverage for the shingles vaccine ("Shingrix").   Health Maintenance, Female Adopting a healthy lifestyle and getting preventive care are important in promoting health and wellness. Ask your health care provider about:  The right schedule for you to have regular tests and exams.  Things you can do on your own to prevent diseases and keep yourself healthy. What should I know about diet, weight, and exercise? Eat a healthy diet   Eat a diet that includes plenty of vegetables, fruits, low-fat dairy products, and lean protein.  Do not eat a lot of foods that are high in solid fats, added sugars, or sodium. Maintain a healthy weight Body mass index (BMI) is used to identify weight problems. It estimates body fat based on height and weight. Your health care provider can help determine your BMI and help you achieve or maintain a healthy weight. Get regular exercise Get regular exercise. This is one of the most important things you can do for your health. Most adults should:  Exercise for at least 150 minutes each week. The exercise should increase your heart rate and make you sweat (moderate-intensity exercise).  Do strengthening exercises at least twice a week. This is in addition to the moderate-intensity exercise.  Spend less time sitting. Even light physical activity can be beneficial. Watch cholesterol and blood lipids Have your blood tested for lipids and cholesterol at 72 years of age, then have this test every 5 years. Have your cholesterol levels checked more often if:  Your lipid or cholesterol levels are high.  You are older than 72 years of age.  You are at high risk for heart disease. What should I know about cancer screening? Depending on your health history and family history, you may need to have cancer screening at various ages. This may include screening for:  Breast cancer.  Cervical cancer.  Colorectal cancer.  Skin cancer.   Lung cancer. What should I know about heart disease, diabetes, and high blood pressure? Blood pressure and heart disease  High blood pressure causes heart disease and increases the risk of stroke. This is more likely to develop in people who have high blood pressure readings, are of African descent, or are overweight.  Have your blood pressure checked: ? Every 3-5 years if you are 18-90 years of age. ? Every year if you are 64 years old or older. Diabetes Have regular diabetes screenings. This checks your fasting blood sugar level. Have the screening done:  Once every three years after age 91 if you are at a normal weight and have a low risk for diabetes.  More often and at a younger age if you are overweight or have a high risk for diabetes. What should I know about preventing infection? Hepatitis B If you have a higher risk for hepatitis B, you should be screened for this virus. Talk with your health care provider to find out if you are at risk for hepatitis B infection. Hepatitis C Testing is recommended for:  Everyone born from [birth year] through 1964.  Anyone with known risk factors for hepatitis C. Sexually transmitted infections (STIs)  Get screened for STIs, including gonorrhea and chlamydia, if: ? You are sexually active and are younger than 72 years of age. ? You are older than 72 years of age and your health care provider tells you that you are at risk for this type of infection. ? Your sexual activity has changed since you were last screened, and you are  at increased risk for chlamydia or gonorrhea. Ask your health care provider if you are at risk.  Ask your health care provider about whether you are at high risk for HIV. Your health care provider may recommend a prescription medicine to help prevent HIV infection. If you choose to take medicine to prevent HIV, you should first get tested for HIV. You should then be tested every 3 months for as long as you are taking the  medicine. Pregnancy  If you are about to stop having your period (premenopausal) and you may become pregnant, seek counseling before you get pregnant.  Take 400 to 800 micrograms (mcg) of folic acid every day if you become pregnant.  Ask for birth control (contraception) if you want to prevent pregnancy. Osteoporosis and menopause Osteoporosis is a disease in which the bones lose minerals and strength with aging. This can result in bone fractures. If you are 45 years old or older, or if you are at risk for osteoporosis and fractures, ask your health care provider if you should:  Be screened for bone loss.  Take a calcium or vitamin D supplement to lower your risk of fractures.  Be given hormone replacement therapy (HRT) to treat symptoms of menopause. Follow these instructions at home: Lifestyle  Do not use any products that contain nicotine or tobacco, such as cigarettes, e-cigarettes, and chewing tobacco. If you need help quitting, ask your health care provider.  Do not use street drugs.  Do not share needles.  Ask your health care provider for help if you need support or information about quitting drugs. Alcohol use  Do not drink alcohol if: ? Your health care provider tells you not to drink. ? You are pregnant, may be pregnant, or are planning to become pregnant.  If you drink alcohol: ? Limit how much you use to 0-1 drink a day. ? Limit intake if you are breastfeeding.  Be aware of how much alcohol is in your drink. In the [place] one drink equals one 12 oz bottle of beer (355 mL), one 5 oz glass of wine (148 mL), or one 1 oz glass of hard liquor (44 mL). General instructions  Schedule regular health, dental, and eye exams.  Stay current with your vaccines.  Tell your health care provider if: ? You often feel depressed. ? You have ever been abused or do not feel safe at home. Summary  Adopting a healthy lifestyle and getting preventive care are important in  promoting health and wellness.  Follow your health care provider's instructions about healthy diet, exercising, and getting tested or screened for diseases.  Follow your health care provider's instructions on monitoring your cholesterol and blood pressure. This information is not intended to replace advice given to you by your health care provider. Make sure you discuss any questions you have with your health care provider. Document Released: 08/11/2010 Document Revised: 01/19/2018 Document Reviewed: 01/19/2018 Elsevier Patient Education  2020 [employer].

## 2018-12-08 LAB — TSH: TSH: 1.83 u[IU]/mL (ref 0.35–4.50)

## 2018-12-09 ENCOUNTER — Encounter: Payer: Self-pay | Admitting: Family Medicine

## 2018-12-11 DIAGNOSIS — R1905 Periumbilic swelling, mass or lump: Secondary | ICD-10-CM

## 2018-12-11 HISTORY — DX: Periumbilic swelling, mass or lump: R19.05

## 2018-12-12 ENCOUNTER — Other Ambulatory Visit: Payer: Self-pay

## 2018-12-12 DIAGNOSIS — K760 Fatty (change of) liver, not elsewhere classified: Secondary | ICD-10-CM

## 2018-12-14 ENCOUNTER — Other Ambulatory Visit: Payer: Self-pay

## 2018-12-14 MED ORDER — ESCITALOPRAM OXALATE 20 MG PO TABS: 20.0000 mg | ORAL_TABLET | Freq: Every day | ORAL | 1 refills | Status: DC

## 2018-12-21 ENCOUNTER — Other Ambulatory Visit: Payer: Self-pay

## 2018-12-21 ENCOUNTER — Ambulatory Visit (HOSPITAL_COMMUNITY)
Admission: RE | Admit: 2018-12-21 | Discharge: 2018-12-21 | Disposition: A | Discharge status: Home / Self Care | Payer: PPO | Source: Ambulatory Visit | Attending: Family Medicine | Admitting: Family Medicine

## 2018-12-21 DIAGNOSIS — K760 Fatty (change of) liver, not elsewhere classified: Secondary | ICD-10-CM | POA: Insufficient documentation

## 2018-12-22 ENCOUNTER — Encounter: Payer: Self-pay | Admitting: Family Medicine

## 2018-12-22 ENCOUNTER — Telehealth: Payer: Self-pay | Admitting: Family Medicine

## 2018-12-22 ENCOUNTER — Other Ambulatory Visit: Payer: Self-pay | Admitting: Family Medicine

## 2018-12-22 DIAGNOSIS — K760 Fatty (change of) liver, not elsewhere classified: Secondary | ICD-10-CM

## 2018-12-22 DIAGNOSIS — R1905 Periumbilic swelling, mass or lump: Secondary | ICD-10-CM

## 2018-12-22 DIAGNOSIS — E7800 Pure hypercholesterolemia, unspecified: Secondary | ICD-10-CM

## 2018-12-22 MED ORDER — ATORVASTATIN CALCIUM 20 MG PO TABS: 20.0000 mg | ORAL_TABLET | Freq: Every day | ORAL | 2 refills | Status: DC

## 2018-12-22 NOTE — Telephone Encounter (Signed)
Lab visit scheduled.

## 2018-12-22 NOTE — Telephone Encounter (Signed)
I discussed recent ultrasound results with pt and she agreed to start atorvastatin. I sent this med in but I forgot to tell her she needs lab visit in 3 mo for fasting lipid panel. I've ordered labs, pls schedule lab appt.-thx

## 2018-12-29 ENCOUNTER — Ambulatory Visit (HOSPITAL_COMMUNITY)
Admission: RE | Admit: 2018-12-29 | Discharge: 2018-12-29 | Disposition: A | Discharge status: Home / Self Care | Payer: PPO | Source: Ambulatory Visit | Attending: Family Medicine | Admitting: Family Medicine

## 2018-12-29 ENCOUNTER — Other Ambulatory Visit: Payer: Self-pay

## 2018-12-29 DIAGNOSIS — R1905 Periumbilic swelling, mass or lump: Secondary | ICD-10-CM

## 2018-12-29 MED ORDER — IOHEXOL 300 MG/ML  SOLN
100.0000 mL | Freq: Once | INTRAMUSCULAR | Status: AC | PRN
  Administered 2018-12-29: 100 mL via INTRAVENOUS

## 2019-02-08 ENCOUNTER — Other Ambulatory Visit: Payer: Self-pay | Admitting: Family Medicine

## 2019-02-08 MED ORDER — MONTELUKAST SODIUM 10 MG PO TABS: 10.0000 mg | ORAL_TABLET | Freq: Every day | ORAL | 3 refills | Status: DC

## 2019-02-08 NOTE — Telephone Encounter (Signed)
Please advise if okay to fill?   Last filled by Dr Sherren Mocha.

## 2019-02-08 NOTE — Telephone Encounter (Signed)
Patient refill request.  Pharmacy had Dr. Honor Junes name on it. (he is retired and she is pt of [place]) Pretty upset at pharmacy.  montelukast (SINGULAIR) 10 MG tablet [id number]   [place], [place] [address]

## 2019-02-09 NOTE — Telephone Encounter (Signed)
Patient aware.

## 2019-02-26 ENCOUNTER — Other Ambulatory Visit: Payer: Self-pay | Admitting: Family Medicine

## 2019-02-27 ENCOUNTER — Other Ambulatory Visit: Payer: Self-pay

## 2019-03-12 ENCOUNTER — Other Ambulatory Visit: Payer: Self-pay | Admitting: Family Medicine

## 2019-03-13 ENCOUNTER — Other Ambulatory Visit: Payer: Self-pay | Admitting: Family Medicine

## 2019-03-24 ENCOUNTER — Other Ambulatory Visit: Payer: Self-pay

## 2019-03-24 ENCOUNTER — Encounter: Payer: Self-pay | Admitting: Family Medicine

## 2019-03-24 ENCOUNTER — Ambulatory Visit (INDEPENDENT_AMBULATORY_CARE_PROVIDER_SITE_OTHER): Payer: PPO | Admitting: Family Medicine

## 2019-03-24 DIAGNOSIS — K760 Fatty (change of) liver, not elsewhere classified: Secondary | ICD-10-CM

## 2019-03-24 DIAGNOSIS — E7800 Pure hypercholesterolemia, unspecified: Secondary | ICD-10-CM

## 2019-03-24 LAB — HEPATIC FUNCTION PANEL
ALT: 25 U/L (ref 0–35)
AST: 24 U/L (ref 0–37)
Albumin: 4.3 g/dL (ref 3.5–5.2)
Alkaline Phosphatase: 90 U/L (ref 39–117)
Bilirubin, Direct: 0.1 mg/dL (ref 0.0–0.3)
Total Bilirubin: 0.5 mg/dL (ref 0.2–1.2)
Total Protein: 7 g/dL (ref 6.0–8.3)

## 2019-03-24 LAB — LIPID PANEL
Cholesterol: 179 mg/dL (ref 0–200)
HDL: 63 mg/dL (ref >39.00)
LDL Cholesterol: 93 mg/dL (ref 0–99)
NonHDL: 116.23
Total CHOL/HDL Ratio: 3
Triglycerides: 116 mg/dL (ref 0.0–149.0)
VLDL: 23.2 mg/dL (ref 0.0–40.0)

## 2019-03-27 ENCOUNTER — Other Ambulatory Visit: Payer: Self-pay

## 2019-03-27 MED ORDER — ATORVASTATIN CALCIUM 20 MG PO TABS: 20.0000 mg | ORAL_TABLET | Freq: Every day | ORAL | 3 refills | Status: DC

## 2019-04-11 DIAGNOSIS — H5203 Hypermetropia, bilateral: Secondary | ICD-10-CM | POA: Diagnosis not present

## 2019-04-11 DIAGNOSIS — D3131 Benign neoplasm of right choroid: Secondary | ICD-10-CM | POA: Diagnosis not present

## 2019-04-23 ENCOUNTER — Other Ambulatory Visit: Payer: Self-pay | Admitting: Family Medicine

## 2019-04-25 ENCOUNTER — Other Ambulatory Visit: Payer: Self-pay | Admitting: Family Medicine

## 2019-05-03 ENCOUNTER — Other Ambulatory Visit: Payer: Self-pay

## 2019-05-03 MED ORDER — OMEPRAZOLE 40 MG PO CPDR: 40.0000 mg | DELAYED_RELEASE_CAPSULE | Freq: Every day | ORAL | 3 refills | Status: DC

## 2019-05-03 NOTE — Telephone Encounter (Signed)
RF request for Omeprazole LOV:12/07/18 Next ov: 06/06/19 Last written:12/13/17(90,3)  Please advise. Medication not previously prescribed by you. Currently pending

## 2019-06-05 ENCOUNTER — Other Ambulatory Visit: Payer: Self-pay

## 2019-06-06 ENCOUNTER — Encounter: Payer: Self-pay | Admitting: Family Medicine

## 2019-06-06 ENCOUNTER — Ambulatory Visit (INDEPENDENT_AMBULATORY_CARE_PROVIDER_SITE_OTHER): Payer: PPO | Admitting: Family Medicine

## 2019-06-06 ENCOUNTER — Other Ambulatory Visit: Payer: Self-pay

## 2019-06-06 VITALS — BP 141/84 | HR 68 | Temp 98.1°F | Resp 16 | Ht 61.5 in | Wt 174.0 lb

## 2019-06-06 DIAGNOSIS — F411 Generalized anxiety disorder: Secondary | ICD-10-CM

## 2019-06-06 DIAGNOSIS — Z79899 Other long term (current) drug therapy: Secondary | ICD-10-CM | POA: Diagnosis not present

## 2019-06-06 DIAGNOSIS — K760 Fatty (change of) liver, not elsewhere classified: Secondary | ICD-10-CM

## 2019-06-06 DIAGNOSIS — I10 Essential (primary) hypertension: Secondary | ICD-10-CM | POA: Diagnosis not present

## 2019-06-06 DIAGNOSIS — E7800 Pure hypercholesterolemia, unspecified: Secondary | ICD-10-CM | POA: Diagnosis not present

## 2019-06-06 DIAGNOSIS — N183 Chronic kidney disease, stage 3 unspecified: Secondary | ICD-10-CM | POA: Diagnosis not present

## 2019-06-06 LAB — COMPREHENSIVE METABOLIC PANEL
ALT: 23 U/L (ref 0–35)
AST: 23 U/L (ref 0–37)
Albumin: 4.4 g/dL (ref 3.5–5.2)
Alkaline Phosphatase: 97 U/L (ref 39–117)
BUN: 29 mg/dL — ABNORMAL HIGH (ref 6–23)
CO2: 28 mEq/L (ref 19–32)
Calcium: 9.6 mg/dL (ref 8.4–10.5)
Chloride: 102 mEq/L (ref 96–112)
Creatinine, Ser: 1.02 mg/dL (ref 0.40–1.20)
GFR: 53.2 mL/min — ABNORMAL LOW (ref >60.00)
Glucose, Bld: 94 mg/dL (ref 70–99)
Potassium: 4.5 mEq/L (ref 3.5–5.1)
Sodium: 137 mEq/L (ref 135–145)
Total Bilirubin: 0.7 mg/dL (ref 0.2–1.2)
Total Protein: 7.2 g/dL (ref 6.0–8.3)

## 2019-06-06 NOTE — Progress Notes (Signed)
OFFICE VISIT  06/06/2019   CC:  Chief Complaint  Patient presents with  . Follow-up    RCI, pt is fasting   HPI:    Patient is a 74 y.o. Caucasian female who presents for 6 mo f/u HTN, HLD, fatty liver, CRI III, and GAD (with benzo use).  Chol elevated last visit, started statin, lipids 03/2019 much improved.  Has mild achiness since getting on statin but "nothing I can't deal with".  RUQ after last visit u/s showed stable hep steat, periumbilical soft tissue mass.  F/u CT showed this mass to be lipomatous.  Feeling fine. Trying to increase her level of physical activity but no formal exercise. Home bp monitoring: avg 140 over 80.  HR avg about 70. Of note, she is taking her metoprolol but HAS NOT been taking her maxzide b/c it makes her urinate too much.  Hx of anxiety and depression: taking SSRI long term as well as lorazepam 0.5mg , 1 bid. She takes this usually only at night.  Only occ dose in daytime for increased anxiety. PMP AWARE reviewed today: most recent rx for lorazepam 0.5mg  was filled 05/22/19, # 5, rx by me. No red flags.  She takes melox 7.5mg  qd, rarely adds a dose of motrin or aleve.  ROS: no fevers, no CP, no SOB, no wheezing, no cough, no dizziness, no HAs, no rashes, no melena/hematochezia.  No polyuria or polydipsia.  No myalgias or arthralgias.  No focal weakness, paresthesias, or tremors.  No acute vision or hearing abnormalities. No n/v/d or abd pain.  No palpitations.     Past Medical History:  Diagnosis Date  . Allergic rhinitis   . Anxiety and depression   . Chronic renal insufficiency, stage 3 (moderate)    baseline GFR mid 40s as of 2020  . GERD (gastroesophageal reflux disease)    + hx of reflux esophagitis  . Hepatic steatosis 2012   no signif elevation of LFTs in the past. Stable on u/s 12/2018.  Marland Kitchen History of acoustic neuroma 1993   left; resected by Dr. Darrold Span at [facility]  . Hyperlipidemia 2012   statin in the past, but prior pcp d/c'd it  b/c pt's sister was dx'd with cirrhosis from NASH.  Pt's abd u/s showed hepatic steatosis 12/2018.  Great response to atorva started 12/2018.  Marland Kitchen Hypertension   . Obstructive sleep apnea    remote past--intol of CPAP->?effort? to get assistance.  Referred for re-eval 05/2018.  Marland Kitchen Periumbilical mass [id number]   u/s 12/2018, indeterminate->CT abd showed this was a benign lipoma.    Past Surgical History:  Procedure Laterality Date  . ACOUSTIC NEUROMA RESECTION Left 1993    ([facility]). Chronic L ear hearing loss.  Marland Kitchen BREAST EXCISIONAL BIOPSY Left 1980s  . COLONOSCOPY  2000; 06/18/2009   Normal--recall 10 yrs (Dr. Deatra Ina).  . TOTAL ABDOMINAL HYSTERECTOMY W/ BILATERAL SALPINGOOPHORECTOMY  REMOTE PAST   nonmalignant reason    Outpatient Medications Prior to Visit  Medication Sig Dispense Refill  . aspirin 81 MG tablet Take 81 mg by mouth daily.      Marland Kitchen atorvastatin (LIPITOR) 20 MG tablet Take 1 tablet by mouth once daily 90 tablet 0  . escitalopram (LEXAPRO) 20 MG tablet Take 1 tablet (20 mg total) by mouth daily. 100 tablet 1  . loratadine (CLARITIN) 10 MG tablet Take 10 mg by mouth daily as needed.     Marland Kitchen LORazepam (ATIVAN) 0.5 MG tablet 1 p.o. twice daily 60 tablet 5  . meloxicam (  MOBIC) 7.5 MG tablet Take 7.5 mg by mouth daily.  1  . metoprolol succinate (TOPROL-XL) 50 MG 24 hr tablet Take 1 tablet (50 mg total) by mouth daily. Take with or immediately following a meal. 90 tablet 3  . Misc Natural Products (MEGA ENERGY PO) Take by mouth.    . montelukast (SINGULAIR) 10 MG tablet Take 1 tablet (10 mg total) by mouth at bedtime. 90 tablet 3  . Multiple Vitamins-Minerals (EMERGEN-C FIVE PO) Take by mouth.    Marland Kitchen omeprazole (PRILOSEC) 40 MG capsule Take 1 capsule (40 mg total) by mouth daily. 90 capsule 3  . OVER THE COUNTER MEDICATION Take 1 capsule by mouth daily. 1 Tumeric capsule daily    . triamcinolone cream (KENALOG) 0.1 % APPLY CREAM EXTERNALLY TO AFFECTED AREA TWICE DAILY AS NEEDED    .  triamterene-hydrochlorothiazide (MAXZIDE-25) 37.5-25 MG tablet Take 1 tablet by mouth daily. 90 tablet 3   No facility-administered medications prior to visit.    No Known Allergies  ROS As per HPI  PE: Vitals with BMI 06/06/2019 12/07/2018 06/07/2018  Height 5' 1.5" 5' 1.5" -  Weight 174 lbs 172 lbs -  BMI [id number] -  Systolic Q000111Q [id number]  Diastolic 84 84 79  Pulse 68 68 73    Gen: Alert, well appearing.  Patient is oriented to person, place, time, and situation. AFFECT: pleasant, lucid thought and speech. CV: RRR, no m/r/g.   LUNGS: CTA bilat, nonlabored resps, good aeration in all lung fields. EXT: no clubbing or cyanosis.  no edema.    LABS:  Lab Results  Component Value Date   TSH 1.83 12/07/2018   Lab Results  Component Value Date   WBC 5.6 12/07/2018   HGB 13.6 12/07/2018   HCT 41.3 12/07/2018   MCV 92.8 12/07/2018   PLT 222.0 12/07/2018   Lab Results  Component Value Date   CREATININE 0.95 12/07/2018   BUN 21 12/07/2018   NA 139 12/07/2018   K 4.7 12/07/2018   CL 103 12/07/2018   CO2 31 12/07/2018   Lab Results  Component Value Date   ALT 25 03/24/2019   AST 24 03/24/2019   ALKPHOS 90 03/24/2019   BILITOT 0.5 03/24/2019   Lab Results  Component Value Date   CHOL 179 03/24/2019   Lab Results  Component Value Date   HDL 63.00 03/24/2019   Lab Results  Component Value Date   LDLCALC 93 03/24/2019   Lab Results  Component Value Date   TRIG 116.0 03/24/2019   Lab Results  Component Value Date   CHOLHDL 3 03/24/2019   Lab Results  Component Value Date   HGBA1C 5.7 06/08/2016    IMPRESSION AND PLAN:  1) HLD with obesity and hepatic steatosis: good response to statin. Tolerating this med moderately well, wants to continue it despite mild achiness. CMET today. Work on [employer]. Plan FLP approx 6 mo.  2) HTN, not well controlled.  Has not been taking maxzide but she says she is willing to restart it and just put up with the increase  in urination on this med.  Restart this med and continue toprol xl. We'll see what home bp's are on this regimen in 1 mo.  3) Hx of MDD and GAD: doing well on SSRI long term as well as benzo 1-2 times per day. CSC UTD. No new rx's needed today.  4) CRI III: sCr 0.95 and GFR 58 ml/min six months ago. Takes low dose NSAID  daily b/c it is helpful for her significant osteoarthritis. Recommended she minimize use of any ADDITIONAL (otc) NSAIDs. Continue to work on good hydration habits. Lytes/cr today.  An After Visit Summary was printed and given to the patient.  FOLLOW UP: Return in about 4 weeks (around 07/04/2019) for f/u HTN.  Signed:  Crissie Sickles, MD           06/06/2019

## 2019-06-06 NOTE — Patient Instructions (Signed)
Restart your triamterine-hydrochlorothiazide 37.5-25 tab daily.

## 2019-07-04 ENCOUNTER — Other Ambulatory Visit: Payer: Self-pay

## 2019-07-04 ENCOUNTER — Ambulatory Visit (INDEPENDENT_AMBULATORY_CARE_PROVIDER_SITE_OTHER): Payer: PPO | Admitting: Family Medicine

## 2019-07-04 ENCOUNTER — Encounter: Payer: Self-pay | Admitting: Family Medicine

## 2019-07-04 VITALS — BP 118/80 | HR 72 | Temp 97.6°F | Resp 16 | Ht 61.5 in | Wt 175.0 lb

## 2019-07-04 DIAGNOSIS — I10 Essential (primary) hypertension: Secondary | ICD-10-CM | POA: Diagnosis not present

## 2019-07-04 MED ORDER — ESCITALOPRAM OXALATE 20 MG PO TABS: 20.0000 mg | ORAL_TABLET | Freq: Every day | ORAL | 1 refills | Status: DC

## 2019-07-04 NOTE — Progress Notes (Signed)
OFFICE VISIT  07/04/2019   CC:  Chief Complaint  Patient presents with  . Follow-up    hypertension, pt is fasting   HPI:    Patient is a 74 y.o. Caucasian female who presents for 1 mo f/u uncontrolled HTN in the setting of CRI stage IIIa. A/P as of last visit: "1) HLD with obesity and hepatic steatosis: good response to statin. Tolerating this med moderately well, wants to continue it despite mild achiness. CMET today. Work on [employer]. Plan FLP approx 6 mo.  2) HTN, not well controlled.  Has not been taking maxzide but she says she is willing to restart it and just put up with the increase in urination on this med.  Restart this med and continue toprol xl. We'll see what home bp's are on this regimen in 1 mo.  3) Hx of MDD and GAD: doing well on SSRI long term as well as benzo 1-2 times per day. CSC UTD. No new rx's needed today.  4) CRI III: sCr 0.95 and GFR 58 ml/min six months ago. Takes low dose NSAID daily b/c it is helpful for her significant osteoarthritis. Recommended she minimize use of any ADDITIONAL (otc) NSAIDs. Continue to work on good hydration habits. Lytes/cr today."  INTERIM HX: Labs last visit all stable. Feeling fine, tolerating the maxzide well now. Home bp avg 130/70s the last month.   Trying to drink more water.  Trying to increase # of steps each day. Went to [employer] recently, lots of fun.  Past Medical History:  Diagnosis Date  . Allergic rhinitis   . Anxiety and depression   . Chronic renal insufficiency, stage 3 (moderate)    baseline GFR mid 40s as of 2020  . GERD (gastroesophageal reflux disease)    + hx of reflux esophagitis  . Hepatic steatosis 2012   no signif elevation of LFTs in the past. Stable on u/s 12/2018.  Marland Kitchen History of acoustic neuroma 1993   left; resected by Dr. Darrold Span at [facility]  . Hyperlipidemia 2012   statin in the past, but prior pcp d/c'd it b/c pt's sister was dx'd with cirrhosis from NASH.  Pt's abd u/s  showed hepatic steatosis 12/2018.  Great response to atorva started 12/2018.  Marland Kitchen Hypertension   . Obstructive sleep apnea    remote past--intol of CPAP->?effort? to get assistance.  Referred for re-eval 05/2018.  Marland Kitchen Periumbilical mass [id number]   u/s 12/2018, indeterminate->CT abd showed this was a benign lipoma.    Past Surgical History:  Procedure Laterality Date  . ACOUSTIC NEUROMA RESECTION Left 1993    ([facility]). Chronic L ear hearing loss.  Marland Kitchen BREAST EXCISIONAL BIOPSY Left 1980s  . COLONOSCOPY  2000; 06/18/2009   Normal--recall 10 yrs (Dr. Deatra Ina).  . TOTAL ABDOMINAL HYSTERECTOMY W/ BILATERAL SALPINGOOPHORECTOMY  REMOTE PAST   nonmalignant reason    Outpatient Medications Prior to Visit  Medication Sig Dispense Refill  . aspirin 81 MG tablet Take 81 mg by mouth daily.      Marland Kitchen atorvastatin (LIPITOR) 20 MG tablet Take 1 tablet by mouth once daily 90 tablet 0  . escitalopram (LEXAPRO) 20 MG tablet Take 1 tablet (20 mg total) by mouth daily. 100 tablet 1  . loratadine (CLARITIN) 10 MG tablet Take 10 mg by mouth daily as needed.     Marland Kitchen LORazepam (ATIVAN) 0.5 MG tablet 1 p.o. twice daily 60 tablet 5  . meloxicam (MOBIC) 7.5 MG tablet Take 7.5 mg by mouth daily.  1  .  metoprolol succinate (TOPROL-XL) 50 MG 24 hr tablet Take 1 tablet (50 mg total) by mouth daily. Take with or immediately following a meal. 90 tablet 3  . Misc Natural Products (MEGA ENERGY PO) Take by mouth.    . montelukast (SINGULAIR) 10 MG tablet Take 1 tablet (10 mg total) by mouth at bedtime. 90 tablet 3  . Multiple Vitamins-Minerals (EMERGEN-C FIVE PO) Take by mouth.    Marland Kitchen omeprazole (PRILOSEC) 40 MG capsule Take 1 capsule (40 mg total) by mouth daily. 90 capsule 3  . OVER THE COUNTER MEDICATION Take 1 capsule by mouth daily. 1 Tumeric capsule daily    . triamcinolone cream (KENALOG) 0.1 % APPLY CREAM EXTERNALLY TO AFFECTED AREA TWICE DAILY AS NEEDED    . triamterene-hydrochlorothiazide (MAXZIDE-25) 37.5-25 MG tablet Take 1  tablet by mouth daily. 90 tablet 3   No facility-administered medications prior to visit.    No Known Allergies  ROS As per HPI  PE: Blood pressure 118/80, pulse 72, temperature 97.6 F (36.4 C), temperature source Temporal, resp. rate 16, height 5' 1.5" (1.562 m), weight 175 lb (79.4 kg), SpO2 97 %. Body mass index is 32.53 kg/m.  Gen: Alert, well appearing.  Patient is oriented to person, place, time, and situation. AFFECT: pleasant, lucid thought and speech. No further exam today.  LABS:  Lab Results  Component Value Date   TSH 1.83 12/07/2018   Lab Results  Component Value Date   WBC 5.6 12/07/2018   HGB 13.6 12/07/2018   HCT 41.3 12/07/2018   MCV 92.8 12/07/2018   PLT 222.0 12/07/2018   Lab Results  Component Value Date   CREATININE 1.02 06/06/2019   BUN 29 (H) 06/06/2019   NA 137 06/06/2019   K 4.5 06/06/2019   CL 102 06/06/2019   CO2 28 06/06/2019   Lab Results  Component Value Date   ALT 23 06/06/2019   AST 23 06/06/2019   ALKPHOS 97 06/06/2019   BILITOT 0.7 06/06/2019   Lab Results  Component Value Date   CHOL 179 03/24/2019   Lab Results  Component Value Date   HDL 63.00 03/24/2019   Lab Results  Component Value Date   LDLCALC 93 03/24/2019   Lab Results  Component Value Date   TRIG 116.0 03/24/2019   Lab Results  Component Value Date   CHOLHDL 3 03/24/2019   Lab Results  Component Value Date   HGBA1C 5.7 06/08/2016   IMPRESSION AND PLAN:  HTN, now well controlled since getting back on hctz/triam.  Tolerating this med fine. No new labs today. No med changes.  An After Visit Summary was printed and given to the patient.  FOLLOW UP: Return in about 5 months (around 12/04/2019) for annual CPE (fasting).  Signed:  Crissie Sickles, MD           07/04/2019

## 2019-07-16 ENCOUNTER — Other Ambulatory Visit: Payer: Self-pay | Admitting: Family Medicine

## 2019-07-17 NOTE — Telephone Encounter (Signed)
Requesting: lorazepam Contract:12/07/18 UDS:n/a Last Visit:07/04/19 Next Visit: advised to f/u 5 mo.  Last Refill:10//28/20(60,5)  Please Advise. Medication pending

## 2019-07-23 ENCOUNTER — Other Ambulatory Visit: Payer: Self-pay | Admitting: Family Medicine

## 2019-08-02 ENCOUNTER — Encounter: Payer: Self-pay | Admitting: Gastroenterology

## 2019-08-02 ENCOUNTER — Ambulatory Visit (AMBULATORY_SURGERY_CENTER): Payer: Self-pay

## 2019-08-02 ENCOUNTER — Other Ambulatory Visit: Payer: Self-pay

## 2019-08-02 VITALS — Ht 61.5 in | Wt 177.0 lb

## 2019-08-02 DIAGNOSIS — Z1211 Encounter for screening for malignant neoplasm of colon: Secondary | ICD-10-CM

## 2019-08-02 MED ORDER — SUTAB 1479-225-188 MG PO TABS: 12.0000 | ORAL_TABLET | ORAL | 0 refills | Status: DC

## 2019-08-02 NOTE — Progress Notes (Signed)
No allergies to soy or egg Pt is not on blood thinners or diet pills Denies issues with sedation/intubation Denies atrial flutter/fib Denies constipation   Emmi instructions given to pt  Pt is aware of Covid safety and care partner requirements.  

## 2019-08-16 ENCOUNTER — Ambulatory Visit (AMBULATORY_SURGERY_CENTER): Payer: PPO | Admitting: Gastroenterology

## 2019-08-16 ENCOUNTER — Other Ambulatory Visit: Payer: Self-pay

## 2019-08-16 ENCOUNTER — Encounter: Payer: Self-pay | Admitting: Gastroenterology

## 2019-08-16 VITALS — BP 131/73 | HR 74 | Temp 96.6°F | Resp 15 | Ht 61.5 in | Wt 177.0 lb

## 2019-08-16 DIAGNOSIS — D122 Benign neoplasm of ascending colon: Secondary | ICD-10-CM

## 2019-08-16 DIAGNOSIS — K219 Gastro-esophageal reflux disease without esophagitis: Secondary | ICD-10-CM | POA: Diagnosis not present

## 2019-08-16 DIAGNOSIS — Z1211 Encounter for screening for malignant neoplasm of colon: Secondary | ICD-10-CM

## 2019-08-16 DIAGNOSIS — I10 Essential (primary) hypertension: Secondary | ICD-10-CM | POA: Diagnosis not present

## 2019-08-16 DIAGNOSIS — G4733 Obstructive sleep apnea (adult) (pediatric): Secondary | ICD-10-CM | POA: Diagnosis not present

## 2019-08-16 DIAGNOSIS — N183 Chronic kidney disease, stage 3 unspecified: Secondary | ICD-10-CM | POA: Diagnosis not present

## 2019-08-16 MED ORDER — SODIUM CHLORIDE 0.9 % IV SOLN: 500.0000 mL | Freq: Once | INTRAVENOUS | Status: DC

## 2019-08-16 NOTE — Progress Notes (Signed)
Pt's states no medical or surgical changes since previsit or office visit.   V/S-CW  Check-in-JB 

## 2019-08-16 NOTE — Progress Notes (Signed)
Called to room to assist during endoscopic procedure.  Patient ID and intended procedure confirmed with present staff. Received instructions for my participation in the procedure from the performing physician.  

## 2019-08-16 NOTE — Op Note (Signed)
[facility] Patient Name: Joan Mahoney Procedure Date: 08/16/2019 8:30 AM MRN: [id number] Endoscopist: Remo Lipps P. Havery Moros , MD Age: 74 Referring MD:  Date of Birth: 1945/08/03 Gender: Female Account #: [id number] Procedure:                Colonoscopy Indications:              Screening for colorectal malignant neoplasm Medicines:                Monitored Anesthesia Care Procedure:                Pre-Anesthesia Assessment:                           - Prior to the procedure, a History and Physical                            was performed, and patient medications and                            allergies were reviewed. The patient's tolerance of                            previous anesthesia was also reviewed. The risks                            and benefits of the procedure and the sedation                            options and risks were discussed with the patient.                            All questions were answered, and informed consent                            was obtained. Prior Anticoagulants: The patient has                            taken no previous anticoagulant or antiplatelet                            agents. ASA Grade Assessment: III - A patient with                            severe systemic disease. After reviewing the risks                            and benefits, the patient was deemed in                            satisfactory condition to undergo the procedure.                           After obtaining informed consent, the colonoscope  was passed under direct vision. Throughout the                            procedure, the patient's blood pressure, pulse, and                            oxygen saturations were monitored continuously. The                            Colonoscope was introduced through the anus and                            advanced to the the cecum, identified by                            appendiceal  orifice and ileocecal valve. The                            colonoscopy was performed without difficulty. The                            patient tolerated the procedure well. The quality                            of the bowel preparation was good. The ileocecal                            valve, appendiceal orifice, and rectum were                            photographed. Scope In: 8:42:56 AM Scope Out: 8:57:00 AM Scope Withdrawal Time: 0 hours 11 minutes 42 seconds  Total Procedure Duration: 0 hours 14 minutes 4 seconds  Findings:                 The perianal and digital rectal examinations were                            normal.                           Scattered small-mouthed diverticula were found in                            the entire colon.                           A 3 mm polyp was found in the ascending colon. The                            polyp was sessile. The polyp was removed with a                            cold snare. Resection and retrieval were complete.  Internal hemorrhoids were found during                            retroflexion. The hemorrhoids were small.                           The exam was otherwise without abnormality. Complications:            No immediate complications. Estimated blood loss:                            Minimal. Estimated Blood Loss:     Estimated blood loss was minimal. Impression:               - Diverticulosis in the entire examined colon.                           - One 3 mm polyp in the ascending colon, removed                            with a cold snare. Resected and retrieved.                           - Internal hemorrhoids.                           - The examination was otherwise normal. Recommendation:           - Patient has a contact number available for                            emergencies. The signs and symptoms of potential                            delayed complications were discussed with the                             patient. Return to normal activities tomorrow.                            Written discharge instructions were provided to the                            patient.                           - Resume previous diet.                           - Continue present medications.                           - Await pathology results. Remo Lipps P. Marzell Allemand, MD 08/16/2019 9:00:40 AM This report has been signed electronically.

## 2019-08-16 NOTE — Progress Notes (Signed)
Report to PACU, RN, vss, BBS= Clear.  

## 2019-08-16 NOTE — Patient Instructions (Signed)
Handout on polyps and diverticulosis given    YOU HAD AN ENDOSCOPIC PROCEDURE TODAY AT THE [facility]:   Refer to the procedure report that was given to you for any specific questions about what was found during the examination.  If the procedure report does not answer your questions, please call your gastroenterologist to clarify.  If you requested that your care partner not be given the details of your procedure findings, then the procedure report has been included in a sealed envelope for you to review at your convenience later.  YOU SHOULD EXPECT: Some feelings of bloating in the abdomen. Passage of more gas than usual.  Walking can help get rid of the air that was put into your GI tract during the procedure and reduce the bloating. If you had a lower endoscopy (such as a colonoscopy or flexible sigmoidoscopy) you may notice spotting of blood in your stool or on the toilet paper. If you underwent a bowel prep for your procedure, you may not have a normal bowel movement for a few days.  Please Note:  You might notice some irritation and congestion in your nose or some drainage.  This is from the oxygen used during your procedure.  There is no need for concern and it should clear up in a day or so.  SYMPTOMS TO REPORT IMMEDIATELY:   Following lower endoscopy (colonoscopy or flexible sigmoidoscopy):  Excessive amounts of blood in the stool  Significant tenderness or worsening of abdominal pains  Swelling of the abdomen that is new, acute  Fever of 100F or higher   For urgent or emergent issues, a gastroenterologist can be reached at any hour by calling [phone]. Do not use MyChart messaging for urgent concerns.    DIET:  We do recommend a small meal at first, but then you may proceed to your regular diet.  Drink plenty of fluids but you should avoid alcoholic beverages for 24 hours.  ACTIVITY:  You should plan to take it easy for the rest of today and you should NOT  DRIVE or use heavy machinery until tomorrow (because of the sedation medicines used during the test).    FOLLOW UP: Our staff will call the number listed on your records 48-72 hours following your procedure to check on you and address any questions or concerns that you may have regarding the information given to you following your procedure. If we do not reach you, we will leave a message.  We will attempt to reach you two times.  During this call, we will ask if you have developed any symptoms of COVID 19. If you develop any symptoms (ie: fever, flu-like symptoms, shortness of breath, cough etc.) before then, please call [phone].  If you test positive for Covid 19 in the 2 weeks post procedure, please call and report this information to us.    If any biopsies were taken you will be contacted by phone or by letter within the next 1-3 weeks.  Please call us at [phone] if you have not heard about the biopsies in 3 weeks.    SIGNATURES/CONFIDENTIALITY: You and/or your care partner have signed paperwork which will be entered into your electronic medical record.  These signatures attest to the fact that that the information above on your After Visit Summary has been reviewed and is understood.  Full responsibility of the confidentiality of this discharge information lies with you and/or your care-partner. 

## 2019-08-18 ENCOUNTER — Telehealth: Payer: Self-pay

## 2019-08-18 NOTE — Telephone Encounter (Signed)
  Follow up Call-  Call back number 08/16/2019  Post procedure Call Back phone  # [phone]  Permission to leave phone message Yes  Some recent data might be hidden     Patient questions:  Do you have a fever, pain , or abdominal swelling? No. Pain Score  0 *  Have you tolerated food without any problems? Yes.    Have you been able to return to your normal activities? Yes.    Do you have any questions about your discharge instructions: Diet   No. Medications  No. Follow up visit  No.  Do you have questions or concerns about your Care? No.  Actions: * If pain score is 4 or above: No action needed, pain <4.  1. Have you developed a fever since your procedure? no  2.   Have you had an respiratory symptoms (SOB or cough) since your procedure? no  3.   Have you tested positive for COVID 19 since your procedure no  4.   Have you had any family members/close contacts diagnosed with the COVID 19 since your procedure?  no   If yes to any of these questions please route to Joylene John, RN and Erenest Rasher, RN

## 2019-08-18 NOTE — Telephone Encounter (Signed)
LVM

## 2019-08-22 ENCOUNTER — Encounter: Payer: Self-pay | Admitting: Family Medicine

## 2019-09-19 ENCOUNTER — Telehealth: Payer: Self-pay

## 2019-09-19 NOTE — Telephone Encounter (Signed)
Okay for letter

## 2019-09-19 NOTE — Telephone Encounter (Signed)
Yes ok for letter

## 2019-09-19 NOTE — Telephone Encounter (Signed)
Patient was notified letter complete and sent via mychart.

## 2019-09-19 NOTE — Telephone Encounter (Signed)
Patient is asking if the letter could be sent through [place].

## 2019-09-19 NOTE — Telephone Encounter (Signed)
Patient was exposed to someone that is Covid + 09/11/19. Patient has a trip booked with [place] that they need to cancel. Patient needs a letter stating that they have been exposed. Patient would like to pick up letter.

## 2019-12-09 ENCOUNTER — Other Ambulatory Visit: Payer: Self-pay | Admitting: Family Medicine

## 2020-01-05 ENCOUNTER — Other Ambulatory Visit: Payer: Self-pay | Admitting: Family Medicine

## 2020-01-08 ENCOUNTER — Other Ambulatory Visit: Payer: Self-pay

## 2020-01-09 ENCOUNTER — Encounter: Payer: PPO | Admitting: Family Medicine

## 2020-01-11 ENCOUNTER — Other Ambulatory Visit: Payer: Self-pay | Admitting: Family Medicine

## 2020-01-31 ENCOUNTER — Other Ambulatory Visit: Payer: Self-pay | Admitting: Family Medicine

## 2020-02-06 ENCOUNTER — Encounter: Payer: Self-pay | Admitting: Family Medicine

## 2020-02-06 ENCOUNTER — Ambulatory Visit (INDEPENDENT_AMBULATORY_CARE_PROVIDER_SITE_OTHER): Payer: PPO | Admitting: Family Medicine

## 2020-02-06 ENCOUNTER — Other Ambulatory Visit: Payer: Self-pay

## 2020-02-06 VITALS — BP 151/84 | HR 64 | Temp 97.9°F | Resp 16 | Ht 61.5 in | Wt 177.0 lb

## 2020-02-06 DIAGNOSIS — Z1231 Encounter for screening mammogram for malignant neoplasm of breast: Secondary | ICD-10-CM

## 2020-02-06 DIAGNOSIS — F411 Generalized anxiety disorder: Secondary | ICD-10-CM

## 2020-02-06 DIAGNOSIS — E7800 Pure hypercholesterolemia, unspecified: Secondary | ICD-10-CM

## 2020-02-06 DIAGNOSIS — I10 Essential (primary) hypertension: Secondary | ICD-10-CM | POA: Diagnosis not present

## 2020-02-06 DIAGNOSIS — Z23 Encounter for immunization: Secondary | ICD-10-CM

## 2020-02-06 DIAGNOSIS — Z79899 Other long term (current) drug therapy: Secondary | ICD-10-CM

## 2020-02-06 DIAGNOSIS — N183 Chronic kidney disease, stage 3 unspecified: Secondary | ICD-10-CM

## 2020-02-06 DIAGNOSIS — Z0000 Encounter for general adult medical examination without abnormal findings: Secondary | ICD-10-CM

## 2020-02-06 LAB — TSH: TSH: 1.59 u[IU]/mL (ref 0.35–4.50)

## 2020-02-06 LAB — COMPREHENSIVE METABOLIC PANEL
ALT: 31 U/L (ref 0–35)
AST: 30 U/L (ref 0–37)
Albumin: 4.3 g/dL (ref 3.5–5.2)
Alkaline Phosphatase: 80 U/L (ref 39–117)
BUN: 19 mg/dL (ref 6–23)
CO2: 30 mEq/L (ref 19–32)
Calcium: 9.3 mg/dL (ref 8.4–10.5)
Chloride: 103 mEq/L (ref 96–112)
Creatinine, Ser: 0.99 mg/dL (ref 0.40–1.20)
GFR: 56.71 mL/min — ABNORMAL LOW (ref >60.00)
Glucose, Bld: 81 mg/dL (ref 70–99)
Potassium: 4 mEq/L (ref 3.5–5.1)
Sodium: 140 mEq/L (ref 135–145)
Total Bilirubin: 0.7 mg/dL (ref 0.2–1.2)
Total Protein: 6.9 g/dL (ref 6.0–8.3)

## 2020-02-06 LAB — CBC WITH DIFFERENTIAL/PLATELET
Basophils Absolute: 0.1 10*3/uL (ref 0.0–0.1)
Basophils Relative: 1.6 % (ref 0.0–3.0)
Eosinophils Absolute: 0.2 10*3/uL (ref 0.0–0.7)
Eosinophils Relative: 4.1 % (ref 0.0–5.0)
HCT: 40.4 % (ref 36.0–46.0)
Hemoglobin: 13.3 g/dL (ref 12.0–15.0)
Lymphocytes Relative: 25.2 % (ref 12.0–46.0)
Lymphs Abs: 1.5 10*3/uL (ref 0.7–4.0)
MCHC: 33 g/dL (ref 30.0–36.0)
MCV: 93.5 fl (ref 78.0–100.0)
Monocytes Absolute: 0.4 10*3/uL (ref 0.1–1.0)
Monocytes Relative: 6.1 % (ref 3.0–12.0)
Neutro Abs: 3.6 10*3/uL (ref 1.4–7.7)
Neutrophils Relative %: 63 % (ref 43.0–77.0)
Platelets: 190 10*3/uL (ref 150.0–400.0)
RBC: 4.32 Mil/uL (ref 3.87–5.11)
RDW: 14 % (ref 11.5–15.5)
WBC: 5.8 10*3/uL (ref 4.0–10.5)

## 2020-02-06 LAB — LIPID PANEL
Cholesterol: 166 mg/dL (ref 0–200)
HDL: 63.8 mg/dL (ref >39.00)
LDL Cholesterol: 82 mg/dL (ref 0–99)
NonHDL: 102.11
Total CHOL/HDL Ratio: 3
Triglycerides: 101 mg/dL (ref 0.0–149.0)
VLDL: 20.2 mg/dL (ref 0.0–40.0)

## 2020-02-06 MED ORDER — AMLODIPINE BESYLATE 10 MG PO TABS: 10.0000 mg | ORAL_TABLET | Freq: Every day | ORAL | 1 refills | Status: DC

## 2020-02-06 MED ORDER — FAMOTIDINE 40 MG PO TABS: 40.0000 mg | ORAL_TABLET | Freq: Every day | ORAL | 11 refills | Status: DC

## 2020-02-06 MED ORDER — ESCITALOPRAM OXALATE 20 MG PO TABS
20.0000 mg | ORAL_TABLET | Freq: Every day | ORAL | 3 refills | Status: DC
Start: 2020-02-06 — End: 2021-02-04

## 2020-02-06 MED ORDER — AMLODIPINE BESYLATE 5 MG PO TABS: 5.0000 mg | ORAL_TABLET | Freq: Every day | ORAL | 1 refills | Status: DC

## 2020-02-06 NOTE — Progress Notes (Signed)
Office Note 02/06/2020  CC:  Chief Complaint  Patient presents with  . Annual Exam    Pt is fasting    HPI:  Joan Mahoney is a 74 y.o. White female who is here for annual health maintenance exam and f/u HTN, CRI III, and GAD (high risk med use).  HTN:  Home monitoring 130s-140s, some in 120s.  Diast 80s. HR not recalled well by pt. She is not taking her maxzide b/c it makes her pee too much.  CRI: avoids otc NSAIDs.  Meloxicam is on her med list but she is not sure if she takes this med---she does have chronic bilat shoulder arthritic pain. Suboptimal water intake.  Lexapro and loraz (usually just hs) long term for anx/dep, doing well and tolerating well. PMP AWARE reviewed today: most recent rx for lorazepam was filled 01/05/20, # 60, rx by me. No red flags.  Past Medical History:  Diagnosis Date  . Allergic rhinitis   . Allergy    seasonal  . Anxiety and depression   . Arthritis    Osteoarthritis  . Cataract    not ripe yet.  . Chronic renal insufficiency, stage 3 (moderate) (HCC)    baseline GFR mid 40s as of 2020  . GERD (gastroesophageal reflux disease)    + hx of reflux esophagitis  . Hepatic steatosis 2012   no signif elevation of LFTs in the past. Stable on u/s 12/2018.  Marland Kitchen History of acoustic neuroma 1993   left; resected by Dr. Darrold Span at [facility]  . Hyperlipidemia 2012   statin in the past, but prior pcp d/c'd it b/c pt's sister was dx'd with cirrhosis from NASH.  Pt's abd u/s showed hepatic steatosis 12/2018.  Great response to atorva started 12/2018.  Marland Kitchen Hypertension   . Obstructive sleep apnea    remote past--intol of CPAP->?effort? to get assistance.  Referred for re-eval 05/2018.  Marland Kitchen Periumbilical mass [id number]   u/s 12/2018, indeterminate->CT abd showed this was a benign lipoma.  . Sleep apnea    no using CPAP    Past Surgical History:  Procedure Laterality Date  . ACOUSTIC NEUROMA RESECTION Left 1993    ([facility]). Chronic L ear hearing loss.   Marland Kitchen BREAST EXCISIONAL BIOPSY Left 1980s  . COLONOSCOPY  2000; 06/18/2009; 08/2019   1 small polyp NONADENOMATOUS- 08/2019.  Marland Kitchen TOTAL ABDOMINAL HYSTERECTOMY W/ BILATERAL SALPINGOOPHORECTOMY  REMOTE PAST   nonmalignant reason    Family History  Problem Relation Age of Onset  . Breast cancer Cousin   . Diabetes type II Sister   . Cirrhosis Sister        Non-alcoholic  . Colon cancer Neg Hx   . Colon polyps Neg Hx   . Esophageal cancer Neg Hx   . Rectal cancer Neg Hx   . Stomach cancer Neg Hx     Social History   Socioeconomic History  . Marital status: Married    Spouse name: Not on file  . Number of children: Not on file  . Years of education: Not on file  . Highest education level: Not on file  Occupational History  . Not on file  Tobacco Use  . Smoking status: Former Research scientist (life sciences)  . Smokeless tobacco: Never Used  Vaping Use  . Vaping Use: Never used  Substance and Sexual Activity  . Alcohol use: Never  . Drug use: Never  . Sexual activity: Not on file  Other Topics Concern  . Not on file  Social History Narrative  Married, lives in [place].   I see her husband Jeneen Rinks.   No T/A/Ds.   Social Determinants of Health   Financial Resource Strain: Not on file  Food Insecurity: Not on file  Transportation Needs: Not on file  Physical Activity: Not on file  Stress: Not on file  Social Connections: Not on file  Intimate Partner Violence: Not on file    Outpatient Medications Prior to Visit  Medication Sig Dispense Refill  . aspirin 81 MG tablet Take 81 mg by mouth daily.    Marland Kitchen atorvastatin (LIPITOR) 20 MG tablet Take 1 tablet by mouth once daily 90 tablet 3  . Fexofenadine HCl (ALLEGRA PO) Take by mouth.    Marland Kitchen LORazepam (ATIVAN) 0.5 MG tablet Take 1 tablet by mouth twice daily 60 tablet 5  . metoprolol succinate (TOPROL-XL) 50 MG 24 hr tablet TAKE 1 TABLET BY MOUTH ONCE DAILY TAKE  WITH  OR  IMMEDIATELY  FOLLOWING  A  MEAL 90 tablet 1  . Misc Natural Products (MEGA ENERGY  PO) Take by mouth.    . montelukast (SINGULAIR) 10 MG tablet TAKE 1 TABLET BY MOUTH AT BEDTIME 90 tablet 1  . Multiple Vitamins-Minerals (EMERGEN-C FIVE PO) Take by mouth.    Marland Kitchen OVER THE COUNTER MEDICATION Take 1 capsule by mouth daily. 1 Tumeric capsule daily    . triamcinolone cream (KENALOG) 0.1 % APPLY CREAM EXTERNALLY TO AFFECTED AREA TWICE DAILY AS NEEDED    . escitalopram (LEXAPRO) 20 MG tablet Take 1 tablet by mouth once daily 30 tablet 0  . meloxicam (MOBIC) 7.5 MG tablet Take 7.5 mg by mouth daily.  1  . omeprazole (PRILOSEC) 40 MG capsule Take 1 capsule (40 mg total) by mouth daily. 90 capsule 3  . triamterene-hydrochlorothiazide (MAXZIDE-25) 37.5-25 MG tablet Take 1 tablet by mouth daily. 90 tablet 3   No facility-administered medications prior to visit.    No Known Allergies  ROS Review of Systems  Constitutional: Negative for appetite change, chills, fatigue and fever.  HENT: Negative for congestion, dental problem, ear pain and sore throat.   Eyes: Negative for discharge, redness and visual disturbance.  Respiratory: Negative for cough, chest tightness, shortness of breath and wheezing.   Cardiovascular: Negative for chest pain, palpitations and leg swelling.  Gastrointestinal: Negative for abdominal pain, blood in stool, diarrhea, nausea and vomiting.  Genitourinary: Negative for difficulty urinating, dysuria, flank pain, frequency, hematuria and urgency.  Musculoskeletal: Negative for arthralgias, back pain, joint swelling, myalgias and neck stiffness.  Skin: Negative for pallor and rash.  Neurological: Negative for dizziness, speech difficulty, weakness and headaches.  Hematological: Negative for adenopathy. Does not bruise/bleed easily.  Psychiatric/Behavioral: Negative for confusion and sleep disturbance. The patient is not nervous/anxious.     PE; Vitals with BMI 02/06/2020 08/16/2019 08/16/2019  Height 5' 1.5" - -  Weight 177 lbs - -  BMI 123XX123 - -  Systolic 123XX123  [id number]  Diastolic 84 73 71  Pulse 64 74 78     Gen: Alert, well appearing.  Patient is oriented to person, place, time, and situation. AFFECT: pleasant, lucid thought and speech. ENT: Ears: EACs clear, normal epithelium.  TMs with good light reflex and landmarks bilaterally.  Eyes: no injection, icteris, swelling, or exudate.  EOMI, PERRLA. Nose: no drainage or turbinate edema/swelling.  No injection or focal lesion.  Mouth: lips without lesion/swelling.  Oral mucosa pink and moist.  Dentition intact and without obvious caries or gingival swelling.  Oropharynx without erythema,  exudate, or swelling.  Neck: supple/nontender.  No LAD, mass, or TM.  Carotid pulses 2+ bilaterally, without bruits. CV: RRR, no m/r/g.   LUNGS: CTA bilat, nonlabored resps, good aeration in all lung fields. ABD: soft, NT, ND, BS normal.  No hepatospenomegaly or mass.  No bruits. EXT: no clubbing, cyanosis, or edema.  Musculoskeletal: no joint swelling, erythema, warmth, or tenderness.  ROM of all joints intact. Skin - no sores or suspicious lesions or rashes or color changes   Pertinent labs:  Lab Results  Component Value Date   TSH 1.83 12/07/2018   Lab Results  Component Value Date   WBC 5.6 12/07/2018   HGB 13.6 12/07/2018   HCT 41.3 12/07/2018   MCV 92.8 12/07/2018   PLT 222.0 12/07/2018   Lab Results  Component Value Date   CREATININE 1.02 06/06/2019   BUN 29 (H) 06/06/2019   NA 137 06/06/2019   K 4.5 06/06/2019   CL 102 06/06/2019   CO2 28 06/06/2019   Lab Results  Component Value Date   ALT 23 06/06/2019   AST 23 06/06/2019   ALKPHOS 97 06/06/2019   BILITOT 0.7 06/06/2019   Lab Results  Component Value Date   CHOL 179 03/24/2019   Lab Results  Component Value Date   HDL 63.00 03/24/2019   Lab Results  Component Value Date   LDLCALC 93 03/24/2019   Lab Results  Component Value Date   TRIG 116.0 03/24/2019   Lab Results  Component Value Date   CHOLHDL 3 03/24/2019    Lab Results  Component Value Date   HGBA1C 5.7 06/08/2016    ASSESSMENT AND PLAN:   1) HTN: poor control. Noncompliant with maxzide b/c of side effect. Will start amlodipine 10mg  qd and continue toprol xl 50mg  qd. Lytes/cr today.  2) HLD: tolerating 20mg  atorva qd. FLP and hepatic panel today.  3) CRI III: she'll make sure when she goes home to check to see if she has meloxicam---I advised her to try not taking this. Needs to improve clear fluid intake. Lytes/cr today.  4) GAD: cont lexapro 20mg  qd and loraz qd-bid prn. CSC renewed today.  5) Health maintenance exam: Reviewed age and gender appropriate health maintenance issues (prudent diet, regular exercise, health risks of tobacco and excessive alcohol, use of seatbelts, fire alarms in home, use of sunscreen).  Also reviewed age and gender appropriate health screening as well as vaccine recommendations. Vaccines:  Flu->given today.  Covid->still needs to get booster. Shingrix->#1 today.  Otherwise ALL UTD. Labs: fasting HP labs today. Cervical ca screening: remote hx of TAH/BSO->not a candidate for any further cerv ca screening. Breast ca screening: June 2020 mammogram normal.  She'll arrange repeat. Colon ca screening:  TCS this year, no adenomatous polyps: no further colon ca screening indicated. Osteoporosis screening: pt has declined DEXA screening for this.  An After Visit Summary was printed and given to the patient.  FOLLOW UP:  Return in about 4 weeks (around 03/05/2020) for f/u htn.  Signed:  , MD           02/06/2020

## 2020-02-06 NOTE — Addendum Note (Signed)
Addended by: Emi Holes D on: 02/06/2020 11:58 AM   Modules accepted: Orders

## 2020-02-20 ENCOUNTER — Other Ambulatory Visit: Payer: Self-pay | Admitting: Family Medicine

## 2020-02-20 NOTE — Telephone Encounter (Signed)
Requesting: ativan Contract:02/06/20 UDS: n/a Last Visit:02/06/20 Next Visit:02/28/20 Last Refill: 07/17/19 (60,5)  Please Advise

## 2020-02-27 NOTE — Progress Notes (Deleted)
Subjective:   Joan Mahoney is a 75 y.o. female who presents for an Initial Medicare Annual Wellness Visit.  I connected with  Syan today by a video enabled telemedicine application and verified that I am speaking with the correct person using two identifiers.  Location of patient:*** Location of provider:Work  Persons participating in the virtual visit: patient, nurse.   I discussed the limitations, risk, security and privacy concerns of evaluation and management by telemedicine. The patient expressed understanding and agreed to proceed.  Some vital signs may be absent or patient reported.   Review of Systems    ***       Objective:    There were no vitals filed for this visit. There is no height or weight on file to calculate BMI.  No flowsheet data found.  Current Medications (verified) Outpatient Encounter Medications as of 02/28/2020  Medication Sig  . amLODipine (NORVASC) 10 MG tablet Take 1 tablet (10 mg total) by mouth daily.  Marland Kitchen aspirin 81 MG tablet Take 81 mg by mouth daily.  Marland Kitchen atorvastatin (LIPITOR) 20 MG tablet Take 1 tablet by mouth once daily  . escitalopram (LEXAPRO) 20 MG tablet Take 1 tablet (20 mg total) by mouth daily.  . famotidine (PEPCID) 40 MG tablet Take 1 tablet (40 mg total) by mouth daily.  Marland Kitchen Fexofenadine HCl (ALLEGRA PO) Take by mouth.  Marland Kitchen LORazepam (ATIVAN) 0.5 MG tablet Take 1 tablet by mouth twice daily  . metoprolol succinate (TOPROL-XL) 50 MG 24 hr tablet TAKE 1 TABLET BY MOUTH ONCE DAILY TAKE  WITH  OR  IMMEDIATELY  FOLLOWING  A  MEAL  . Misc Natural Products (MEGA ENERGY PO) Take by mouth.  . montelukast (SINGULAIR) 10 MG tablet TAKE 1 TABLET BY MOUTH AT BEDTIME  . Multiple Vitamins-Minerals (EMERGEN-C FIVE PO) Take by mouth.  Marland Kitchen OVER THE COUNTER MEDICATION Take 1 capsule by mouth daily. 1 Tumeric capsule daily  . triamcinolone cream (KENALOG) 0.1 % APPLY CREAM EXTERNALLY TO AFFECTED AREA TWICE DAILY AS NEEDED   No  facility-administered encounter medications on file as of 02/28/2020.    Allergies (verified) Patient has no known allergies.   History: Past Medical History:  Diagnosis Date  . Allergic rhinitis   . Allergy    seasonal  . Anxiety and depression   . Arthritis    Osteoarthritis  . Cataract    not ripe yet.  . Chronic renal insufficiency, stage 3 (moderate) (HCC)    baseline GFR mid 40s as of 2020  . GERD (gastroesophageal reflux disease)    + hx of reflux esophagitis  . Hepatic steatosis 2012   no signif elevation of LFTs in the past. Stable on u/s 12/2018.  Marland Kitchen History of acoustic neuroma 1993   left; resected by Dr. Darrold Span at [facility]  . Hyperlipidemia 2012   statin in the past, but prior pcp d/c'd it b/c pt's sister was dx'd with cirrhosis from NASH.  Pt's abd u/s showed hepatic steatosis 12/2018.  Great response to atorva started 12/2018.  Marland Kitchen Hypertension   . Obstructive sleep apnea    remote past--intol of CPAP->?effort? to get assistance.  Referred for re-eval 05/2018.  Marland Kitchen Periumbilical mass 84/1324   u/s 12/2018, indeterminate->CT abd showed this was a benign lipoma.  . Sleep apnea    no using CPAP   Past Surgical History:  Procedure Laterality Date  . ACOUSTIC NEUROMA RESECTION Left 1993    ([facility]). Chronic L ear hearing loss.  Marland Kitchen BREAST  EXCISIONAL BIOPSY Left 1980s  . COLONOSCOPY  2000; 06/18/2009; 08/2019   1 small polyp NONADENOMATOUS- 08/2019.  Marland Kitchen TOTAL ABDOMINAL HYSTERECTOMY W/ BILATERAL SALPINGOOPHORECTOMY  REMOTE PAST   nonmalignant reason   Family History  Problem Relation Age of Onset  . Breast cancer Cousin   . Diabetes type II Sister   . Cirrhosis Sister        Non-alcoholic  . Colon cancer Neg Hx   . Colon polyps Neg Hx   . Esophageal cancer Neg Hx   . Rectal cancer Neg Hx   . Stomach cancer Neg Hx    Social History   Socioeconomic History  . Marital status: Married    Spouse name: Not on file  . Number of children: Not on file  . Years of  education: Not on file  . Highest education level: Not on file  Occupational History  . Not on file  Tobacco Use  . Smoking status: Former Research scientist (life sciences)  . Smokeless tobacco: Never Used  Vaping Use  . Vaping Use: Never used  Substance and Sexual Activity  . Alcohol use: Never  . Drug use: Never  . Sexual activity: Not on file  Other Topics Concern  . Not on file  Social History Narrative   Married, lives in [place].   I see her husband Jeneen Rinks.   No T/A/Ds.   Social Determinants of Health   Financial Resource Strain: Not on file  Food Insecurity: Not on file  Transportation Needs: Not on file  Physical Activity: Not on file  Stress: Not on file  Social Connections: Not on file    Tobacco Counseling Counseling given: Not Answered   Clinical Intake:                 Diabetic?No         Activities of Daily Living No flowsheet data found.  Patient Care Team: Tammi Sou, MD as PCP - General (Family Medicine) Armbruster, Carlota Raspberry, MD as Consulting Physician (Gastroenterology)  Indicate any recent Medical Services you may have received from other than [facility] providers in the past year (date may be approximate).     Assessment:   This is a routine wellness examination for [facility].  Hearing/Vision screen No exam data present  Dietary issues and exercise activities discussed:    Goals   None    Depression Screen PHQ 2/9 Scores 06/06/2019 12/07/2018 05/23/2018 12/09/2015 07/03/2014 04/26/2013  PHQ - 2 Score 0 1 1 0 0 1  PHQ- 9 Score 0 3 - - - -    Fall Risk Fall Risk  06/06/2019 05/23/2018 12/09/2015 12/09/2015 07/03/2014  Falls in the past year? 0 0 No Yes No  Number falls in past yr: 0 0 - 1 -  Injury with Fall? 0 0 - No -  Follow up Falls evaluation completed Falls evaluation completed - - -    FALL RISK PREVENTION PERTAINING TO THE HOME:  Any stairs in or around the home? {YES/NO:21197} If so, are there any without handrails?  {YES/NO:21197} Home free of loose throw rugs in walkways, pet beds, electrical cords, etc? {YES/NO:21197} Adequate lighting in your home to reduce risk of falls? {YES/NO:21197}  ASSISTIVE DEVICES UTILIZED TO PREVENT FALLS:  Life alert? {YES/NO:21197} Use of a cane, walker or w/c? {YES/NO:21197} Grab bars in the bathroom? {YES/NO:21197} Shower chair or bench in shower? {YES/NO:21197} Elevated toilet seat or a handicapped toilet? {YES/NO:21197}  TIMED UP AND GO:  Was the test performed? {YES/NO:21197}.  Length  of time to ambulate 10 feet: *** sec.   {Appearance of XTGG:2694854}  Cognitive Function:        Immunizations Immunization History  Administered Date(s) Administered  . Fluad Quad(high Dose 65+) 10/12/2018, 02/06/2020  . Influenza Split 12/10/2012  . Influenza Whole 02/10/2004, 12/11/2006, 12/09/2007  . Influenza, High Dose Seasonal PF 12/09/2015, 12/08/2016  . Influenza-Unspecified 11/22/2017  . Moderna Sars-Covid-2 Vaccination 04/05/2019, 05/03/2019  . Pneumococcal Conjugate-13 04/26/2013  . Pneumococcal Polysaccharide-23 02/15/2012  . Td 02/10/2000  . Tdap 09/22/2010  . Zoster 07/13/2014  . Zoster Recombinat (Shingrix) 02/06/2020    TDAP status: Up to date  Flu Vaccine status: Up to date  Pneumococcal vaccine status: Up to date  {Covid-19 vaccine status:2101808}  Qualifies for Shingles Vaccine? No   Zostavax completed Yes   Shingrix Completed?: Yes  Screening Tests Health Maintenance  Topic Date Due  . DEXA SCAN  Never done  . COVID-19 Vaccine (3 - Booster for Moderna series) 04/08/2020 (Originally 11/03/2019)  . MAMMOGRAM  07/25/2020  . TETANUS/TDAP  09/21/2020  . COLONOSCOPY (Pts 45-46yrs Insurance coverage will need to be confirmed)  08/15/2029  . INFLUENZA VACCINE  Completed  . Hepatitis C Screening  Completed  . PNA vac Low Risk Adult  Completed    Health Maintenance  Health Maintenance Due  Topic Date Due  . DEXA SCAN  Never done     Colorectal cancer screening: Type of screening: Colonoscopy. Completed 08/16/2019. Repeat every 10 years  {Mammogram status:21018020}  {Bone Density status:21018021}  Lung Cancer Screening: (Low Dose CT Chest recommended if Age 83-80 years, 30 pack-year currently smoking OR have quit w/in 15years.) does not qualify.     Additional Screening:  Hepatitis C Screening: Completed 04/05/2009  Vision Screening: Recommended annual ophthalmology exams for early detection of glaucoma and other disorders of the eye. Is the patient up to date with their annual eye exam?  {YES/NO:21197} Who is the provider or what is the name of the office in which the patient attends annual eye exams? *** If pt is not established with a provider, would they like to be referred to a provider to establish care? {YES/NO:21197}.   Dental Screening: Recommended annual dental exams for proper oral hygiene  Community Resource Referral / Chronic Care Management: CRR required this visit?  {YES/NO:21197}  CCM required this visit?  {YES/NO:21197}     Plan:     I have personally reviewed and noted the following in the patient's chart:   . Medical and social history . Use of alcohol, tobacco or illicit drugs  . Current medications and supplements . Functional ability and status . Nutritional status . Physical activity . Advanced directives . List of other physicians . Hospitalizations, surgeries, and ER visits in previous 12 months . Vitals . Screenings to include cognitive, depression, and falls . Referrals and appointments  In addition, I have reviewed and discussed with patient certain preventive protocols, quality metrics, and best practice recommendations. A written personalized care plan for preventive services as well as general preventive health recommendations were provided to patient.   Due to this being a *** visit, the after visit summary with patients personalized plan was offered to patient  via mail or my-chart. ***Patient declined at this time./ Patient would like to access on my-chart/ per request, patient was mailed a copy of AVS./ Patient preferred to pick up at office at next visit.   Marta Antu, LPN   08/05/348  Nurse Health Advisor  Nurse Notes: ***

## 2020-02-28 ENCOUNTER — Ambulatory Visit: Payer: PPO

## 2020-03-05 ENCOUNTER — Ambulatory Visit (INDEPENDENT_AMBULATORY_CARE_PROVIDER_SITE_OTHER): Payer: PPO | Admitting: Family Medicine

## 2020-03-05 ENCOUNTER — Encounter: Payer: Self-pay | Admitting: Family Medicine

## 2020-03-05 ENCOUNTER — Other Ambulatory Visit: Payer: Self-pay

## 2020-03-05 VITALS — BP 129/80 | HR 79 | Temp 97.8°F | Resp 16 | Wt 172.6 lb

## 2020-03-05 DIAGNOSIS — I10 Essential (primary) hypertension: Secondary | ICD-10-CM

## 2020-03-05 MED ORDER — AMLODIPINE BESYLATE 10 MG PO TABS: 10.0000 mg | ORAL_TABLET | Freq: Every day | ORAL | 3 refills | Status: DC

## 2020-03-05 NOTE — Progress Notes (Signed)
OFFICE VISIT  03/05/2020  CC:  Chief Complaint  Patient presents with  . Follow-up    HTN    HPI:    Patient is a 75 y.o. Caucasian female who presents for 1 mo f/u HTN. A/P as of last visit: "1) HTN: poor control. Noncompliant with maxzide b/c of side effect. Will start amlodipine 10mg  qd and continue toprol xl 50mg  qd. Lytes/cr today.  2) HLD: tolerating 20mg  atorva qd. FLP and hepatic panel today.  3) CRI III: she'll make sure when she goes home to check to see if she has meloxicam---I advised her to try not taking this. Needs to improve clear fluid intake. Lytes/cr today.  4) GAD: cont lexapro 20mg  qd and loraz qd-bid prn. CSC renewed today.  5) Health maintenance exam: Reviewed age and gender appropriate health maintenance issues (prudent diet, regular exercise, health risks of tobacco and excessive alcohol, use of seatbelts, fire alarms in home, use of sunscreen).  Also reviewed age and gender appropriate health screening as well as vaccine recommendations. Vaccines:  Flu->given today.  Covid->still needs to get booster. Shingrix->#1 today.  Otherwise ALL UTD. Labs: fasting HP labs today. Cervical ca screening: remote hx of TAH/BSO->not a candidate for any further cerv ca screening. Breast ca screening: June 2020 mammogram normal.  She'll arrange repeat. Colon ca screening:  TCS this year, no adenomatous polyps: no further colon ca screening indicated. Osteoporosis screening: pt has declined DEXA screening for this."  INTERIM HX: Feeling pretty good. Trying to get more active, eating a little better, more water intake! Felt some generalized weakness some after taking amlodipine initially but this has worn off. No swelling. Home bp's 130/70s, HR 70s.     Past Medical History:  Diagnosis Date  . Allergic rhinitis   . Allergy    seasonal  . Anxiety and depression   . Arthritis    Osteoarthritis  . Cataract    not ripe yet.  . Chronic renal  insufficiency, stage 3 (moderate) (HCC)    baseline GFR mid 40s as of 2020  . GERD (gastroesophageal reflux disease)    + hx of reflux esophagitis  . Hepatic steatosis 2012   no signif elevation of LFTs in the past. Stable on u/s 12/2018.  Marland Kitchen History of acoustic neuroma 1993   left; rese[facility] by [facility] Darrold Span at [facility]  . Hyperlipidemia 2012   statin in the past, but prior pcp d/c'd it b/c pt's sister was dx'd with cirrhosis from NASH.  Pt's abd u/s showed hepatic steatosis 12/2018.  Great response to atorva started 12/2018.  Marland Kitchen Hypertension   . Obstructive sleep apnea    remote past--intol of CPAP->?effort? to get assistance.  Referred for re-eval 05/2018.  Marland Kitchen Periumbilical mass 62/9528   u/s 12/2018, indeterminate->CT abd showed this was a benign lipoma.  . Sleep apnea    no using CPAP    Past Surgical History:  Procedure Laterality Date  . ACOUSTIC NEUROMA RESECTION Left 1993    ([facility]). Chronic L ear hearing loss.  Marland Kitchen BREAST EXCISIONAL BIOPSY Left 1980s  . COLONOSCOPY  2000; 06/18/2009; 08/2019   1 small polyp NONADENOMATOUS- 08/2019.  Marland Kitchen TOTAL ABDOMINAL HYSTERECTOMY W/ BILATERAL SALPINGOOPHORECTOMY  REMOTE PAST   nonmalignant reason    Outpatient Medications Prior to Visit  Medication Sig Dispense Refill  . aspirin 81 MG tablet Take 81 mg by mouth daily.    Marland Kitchen atorvastatin (LIPITOR) 20 MG tablet Take 1 tablet by mouth once daily 90 tablet 3  .  escitalopram (LEXAPRO) 20 MG tablet Take 1 tablet (20 mg total) by mouth daily. 90 tablet 3  . famotidine (PEPCID) 40 MG tablet Take 1 tablet (40 mg total) by mouth daily. 30 tablet 11  . Fexofenadine HCl (ALLEGRA PO) Take by mouth.    Marland Kitchen LORazepam (ATIVAN) 0.5 MG tablet Take 1 tablet by mouth twice daily 60 tablet 5  . metoprolol succinate (TOPROL-XL) 50 MG 24 hr tablet TAKE 1 TABLET BY MOUTH ONCE DAILY TAKE  WITH  OR  IMMEDIATELY  FOLLOWING  A  MEAL 90 tablet 1  . Misc Natural Products (MEGA ENERGY PO) Take by mouth.    . montelukast (SINGULAIR)  10 MG tablet TAKE 1 TABLET BY MOUTH AT BEDTIME 90 tablet 1  . Multiple Vitamins-Minerals (EMERGEN-C FIVE PO) Take by mouth.    Marland Kitchen OVER THE COUNTER MEDICATION Take 1 capsule by mouth daily. 1 Tumeric capsule daily    . amLODipine (NORVASC) 10 MG tablet Take 1 tablet (10 mg total) by mouth daily. 30 tablet 1  . triamcinolone cream (KENALOG) 0.1 % APPLY CREAM EXTERNALLY TO AFFECTED AREA TWICE DAILY AS NEEDED     No facility-administered medications prior to visit.    No Known Allergies  ROS As per HPI  PE: Vitals with BMI 03/05/2020 02/06/2020 08/16/2019  Height - 5' 1.5" -  Weight 172 lbs 10 oz 177 lbs -  BMI 76.28 31.51 -  Systolic 761 607 371  Diastolic 80 84 73  Pulse 79 64 74     Gen: Alert, well appearing.  Patient is oriented to person, place, time, and situation. AFFECT: pleasant, lucid thought and speech. CV: RRR, no m/r/g.   LUNGS: CTA bilat, nonlabored resps, good aeration in all lung fields. EXT: no clubbing or cyanosis.  no edema.    LABS:  Lab Results  Component Value Date   TSH 1.59 02/06/2020   Lab Results  Component Value Date   WBC 5.8 02/06/2020   HGB 13.3 02/06/2020   HCT 40.4 02/06/2020   MCV 93.5 02/06/2020   PLT 190.0 02/06/2020   Lab Results  Component Value Date   CREATININE 0.99 02/06/2020   BUN 19 02/06/2020   NA 140 02/06/2020   K 4.0 02/06/2020   CL 103 02/06/2020   CO2 30 02/06/2020   Lab Results  Component Value Date   ALT 31 02/06/2020   AST 30 02/06/2020   ALKPHOS 80 02/06/2020   BILITOT 0.7 02/06/2020   Lab Results  Component Value Date   CHOL 166 02/06/2020   Lab Results  Component Value Date   HDL 63.80 02/06/2020   Lab Results  Component Value Date   LDLCALC 82 02/06/2020   Lab Results  Component Value Date   TRIG 101.0 02/06/2020   Lab Results  Component Value Date   CHOLHDL 3 02/06/2020   Lab Results  Component Value Date   HGBA1C 5.7 06/08/2016   IMPRESSION AND PLAN:  1) HTN: good control since  adding amlodipine 10mg  qd to her toprol xl 50mg  qd. Continue this and cont periodic home bp/hr monitoring.  An After Visit Summary was printed and given to the patient.  FOLLOW UP: Return in about 6 months (around 09/02/2020) for routine chronic illness f/u.  Signed:  Crissie Sickles, MD           03/05/2020

## 2020-04-16 DIAGNOSIS — H2513 Age-related nuclear cataract, bilateral: Secondary | ICD-10-CM | POA: Diagnosis not present

## 2020-04-16 DIAGNOSIS — H524 Presbyopia: Secondary | ICD-10-CM | POA: Diagnosis not present

## 2020-05-13 ENCOUNTER — Other Ambulatory Visit: Payer: Self-pay | Admitting: Family Medicine

## 2020-05-18 ENCOUNTER — Other Ambulatory Visit: Payer: Self-pay | Admitting: Family Medicine

## 2020-05-29 ENCOUNTER — Ambulatory Visit: Payer: PPO

## 2020-06-13 ENCOUNTER — Other Ambulatory Visit: Payer: Self-pay | Admitting: Family Medicine

## 2020-07-17 IMAGING — CT CT ABD-PELV W/ CM
3 of 5 series · 16 of 46 positions shown, 18 images · IV contrast (Omnipaque or Isovue)
Comparison: None.

CLINICAL DATA: Patient with periumbilical mass for 10 years.

EXAM:
CT ABDOMEN AND PELVIS WITH CONTRAST
TECHNIQUE: Multidetector CT imaging of the abdomen and pelvis was performed
using the standard protocol following bolus administration of
intravenous contrast.
CONTRAST:  100mL OMNIPAQUE IOHEXOL 300 MG/ML  SOLN

[Series 2: axial st · axial · 0.69mm/px · z∈[-557,-172]mm · 12 of 92 slices shown, 14 images]
[im 8/92  soft-tissue]
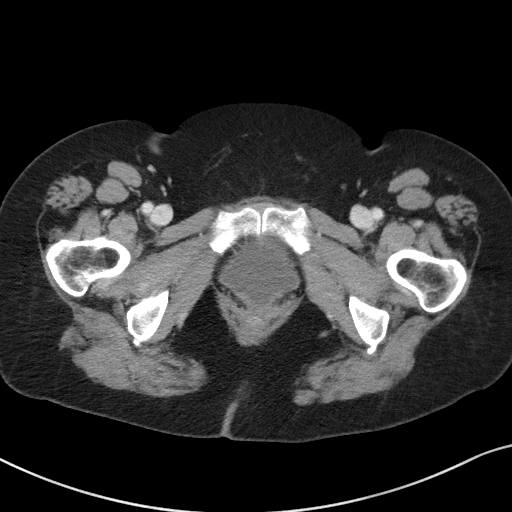
[im 8/92  bone]
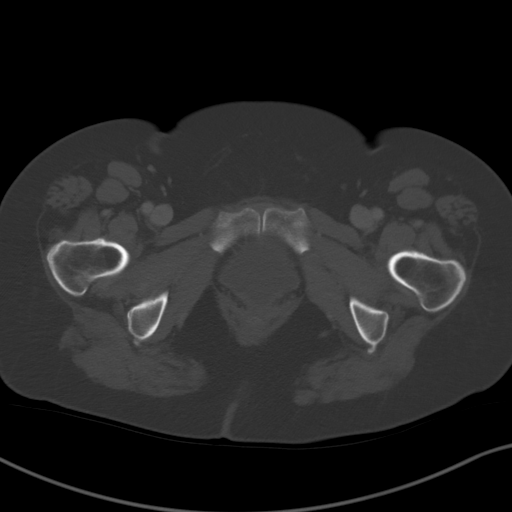
[im 15/92  soft-tissue]
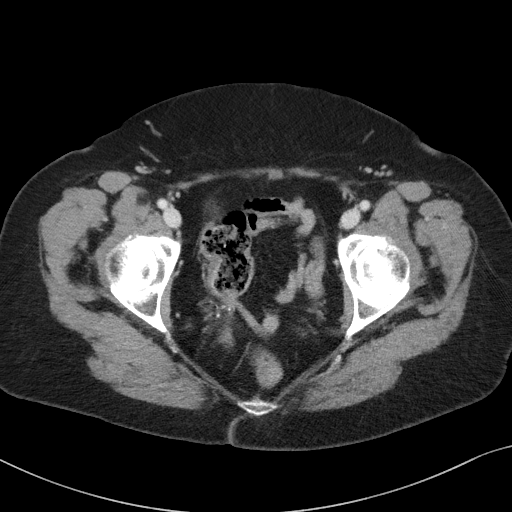
[im 22/92  soft-tissue]
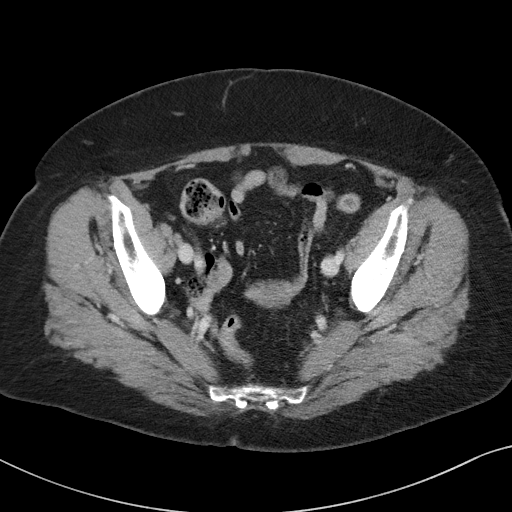
[im 29/92  soft-tissue]
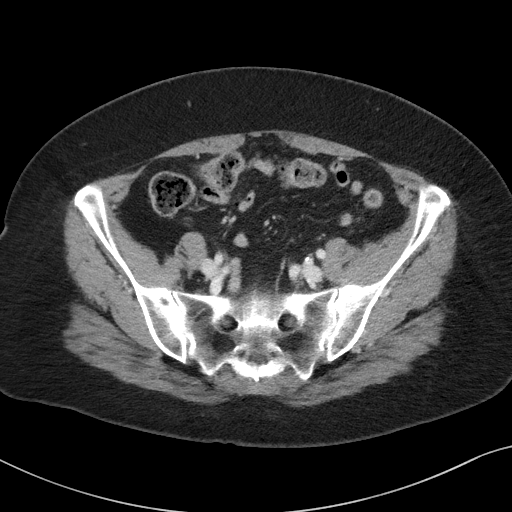
[im 36/92  soft-tissue]
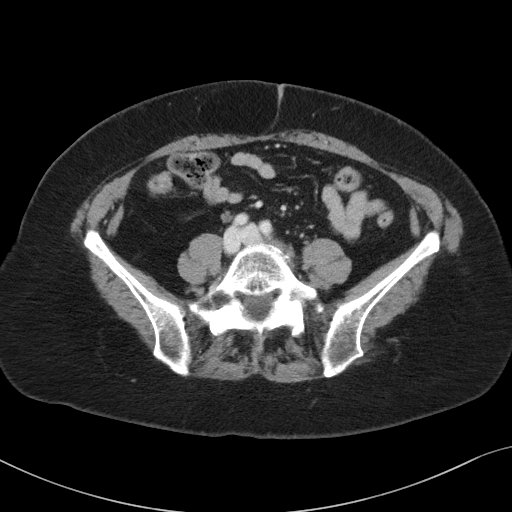
[im 43/92  soft-tissue]
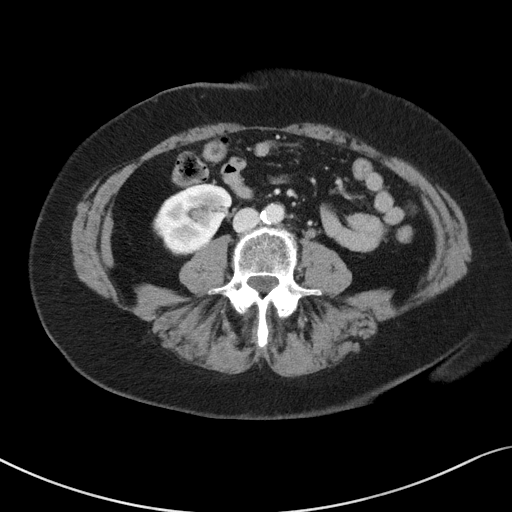
[im 50/92  soft-tissue]
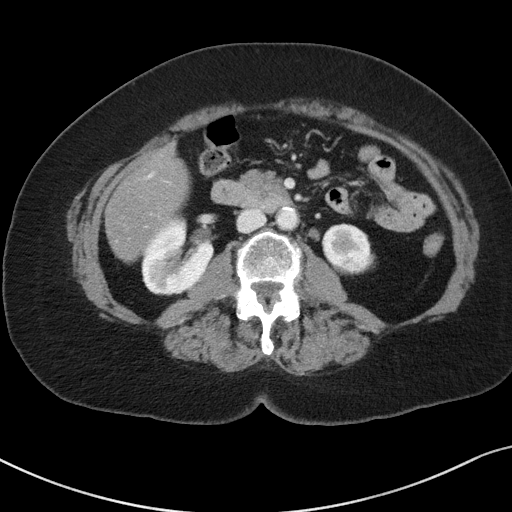
[im 57/92  soft-tissue]
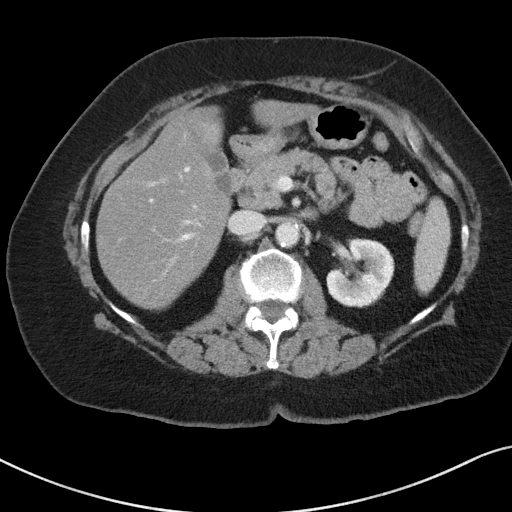
[im 64/92  soft-tissue]
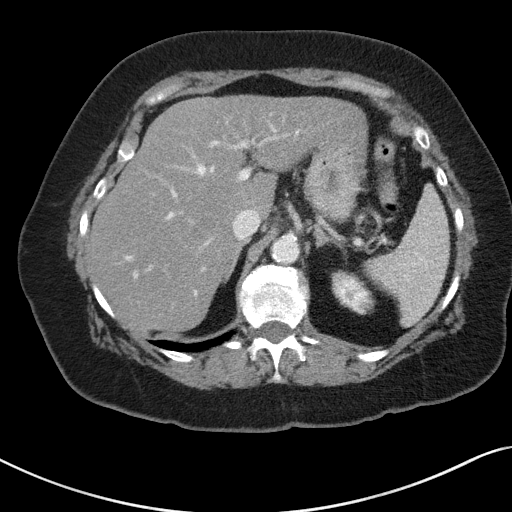
[im 64/92  bone]
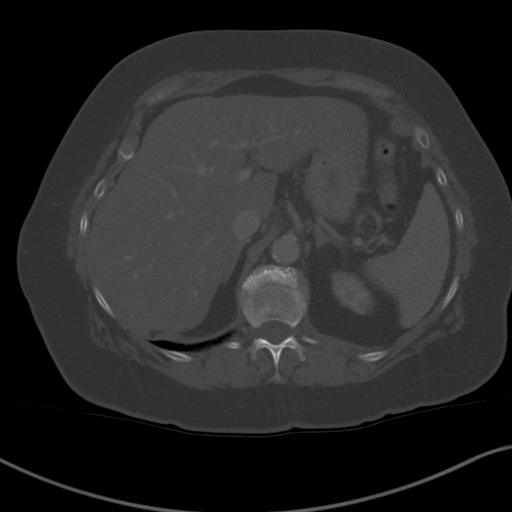
[im 71/92  soft-tissue]
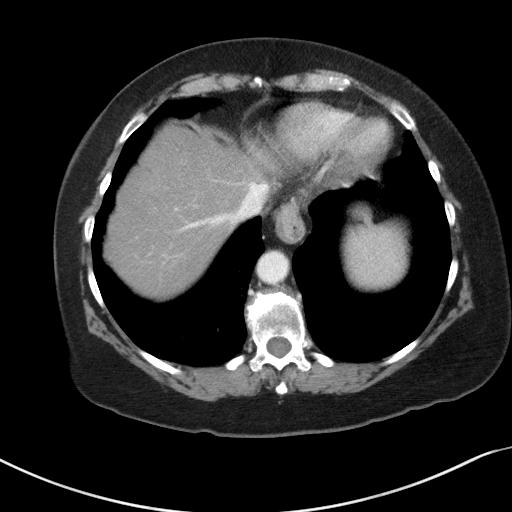
[im 78/92  soft-tissue]
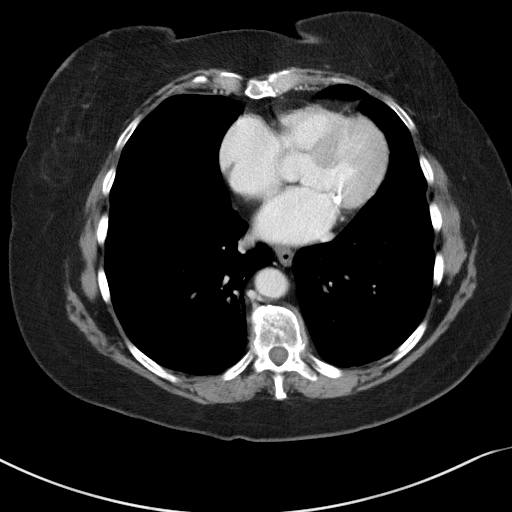
[im 85/92  soft-tissue]
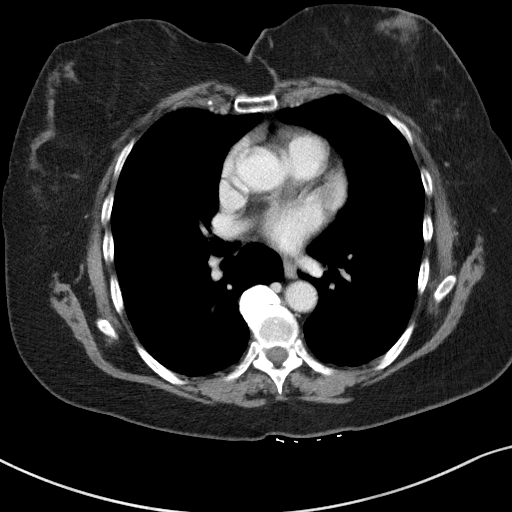

[Series 5: lung bases · axial · 0.69mm/px · 1 of 75 slices shown]
[im 7/75  bone]
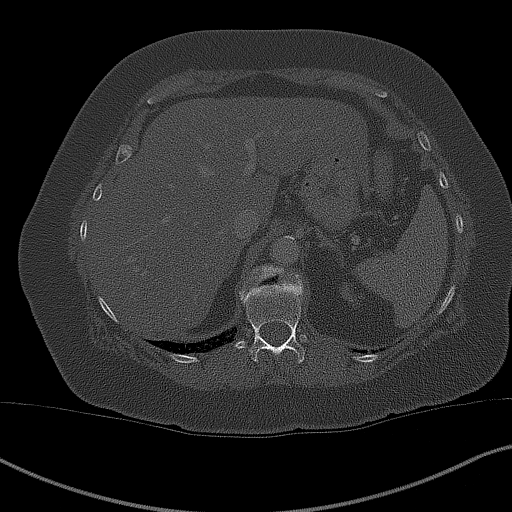

[Series 6: coronal st · coronal · 0.80mm/px · 3 of 109 slices shown]
[im 37/109  soft-tissue]
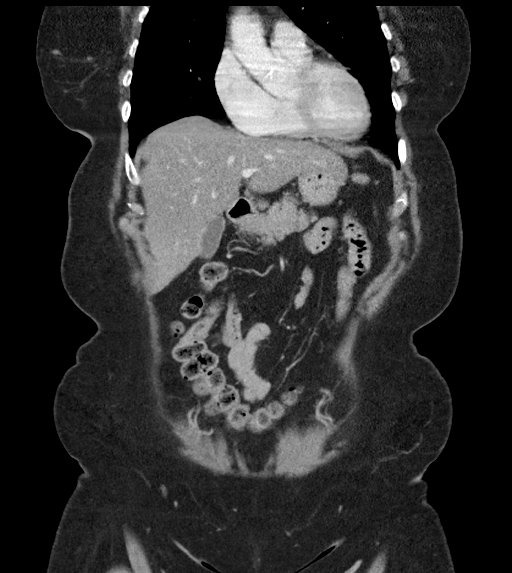
[im 49/109  soft-tissue]
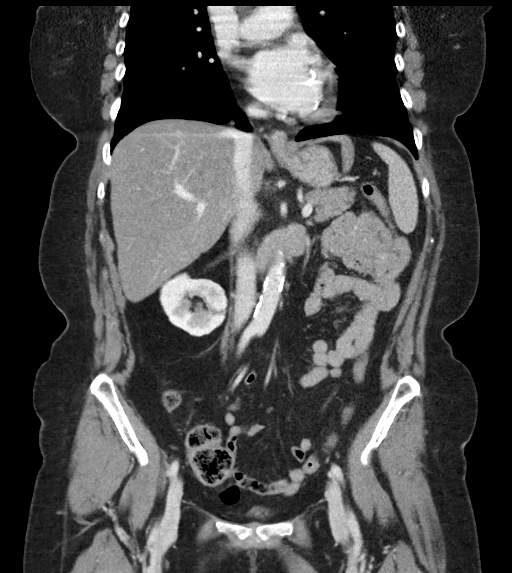
[im 61/109  soft-tissue]
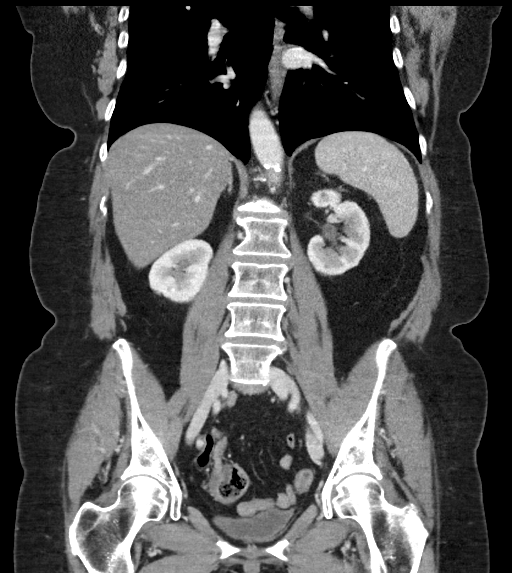

[16 of 46 positions shown; findings below may reference images not displayed]

FINDINGS: Lower chest: Normal heart size. Minimal right basilar
atelectasis/scarring. No pleural effusion.

Hepatobiliary: The liver is normal in size and contour. Gallbladder
is unremarkable. No intrahepatic or extrahepatic biliary ductal
dilatation.

Pancreas: Unremarkable

Spleen: Unremarkable

Adrenals/Urinary Tract: Normal adrenal glands. Kidneys are mildly
lobular in contour. No hydronephrosis. Urinary bladder is
unremarkable.

Stomach/Bowel: No abnormal bowel wall thickening or evidence for
bowel obstruction. No free fluid or free intraperitoneal air. Small
hiatal hernia. Normal morphology of the stomach.

Vascular/Lymphatic: Normal caliber abdominal aorta. Peripheral
calcified atherosclerotic plaque. No retroperitoneal
lymphadenopathy.

Reproductive: Status post hysterectomy.

Other: None.

Musculoskeletal: There is a 5.6 x 3.3 cm fatty mass within the
abdominal wall adjacent to the umbilicus (image 57; series 2) most
compatible with a lipoma. Lumbar spine degenerative changes. No
aggressive or acute appearing osseous lesions.
IMPRESSION: Fatty mass adjacent to the umbilicus compatible with a benign
appearing lipoma.

No acute process in the abdomen or pelvis.

## 2020-07-22 ENCOUNTER — Other Ambulatory Visit: Payer: Self-pay | Admitting: Family Medicine

## 2020-08-02 ENCOUNTER — Other Ambulatory Visit: Payer: Self-pay | Admitting: Family Medicine

## 2020-09-02 ENCOUNTER — Ambulatory Visit (INDEPENDENT_AMBULATORY_CARE_PROVIDER_SITE_OTHER): Payer: PPO | Admitting: Family Medicine

## 2020-09-02 ENCOUNTER — Other Ambulatory Visit: Payer: Self-pay

## 2020-09-02 ENCOUNTER — Encounter: Payer: Self-pay | Admitting: Family Medicine

## 2020-09-02 VITALS — BP 111/76 | HR 75 | Temp 98.0°F | Resp 16 | Ht 61.5 in | Wt 172.2 lb

## 2020-09-02 DIAGNOSIS — F411 Generalized anxiety disorder: Secondary | ICD-10-CM

## 2020-09-02 DIAGNOSIS — I10 Essential (primary) hypertension: Secondary | ICD-10-CM | POA: Diagnosis not present

## 2020-09-02 DIAGNOSIS — N1831 Chronic kidney disease, stage 3a: Secondary | ICD-10-CM | POA: Diagnosis not present

## 2020-09-02 DIAGNOSIS — E7800 Pure hypercholesterolemia, unspecified: Secondary | ICD-10-CM

## 2020-09-02 LAB — COMPREHENSIVE METABOLIC PANEL
ALT: 23 U/L (ref 0–35)
AST: 23 U/L (ref 0–37)
Albumin: 4.4 g/dL (ref 3.5–5.2)
Alkaline Phosphatase: 80 U/L (ref 39–117)
BUN: 29 mg/dL — ABNORMAL HIGH (ref 6–23)
CO2: 28 mEq/L (ref 19–32)
Calcium: 10.1 mg/dL (ref 8.4–10.5)
Chloride: 104 mEq/L (ref 96–112)
Creatinine, Ser: 1.09 mg/dL (ref 0.40–1.20)
GFR: 50.33 mL/min — ABNORMAL LOW (ref >60.00)
Glucose, Bld: 91 mg/dL (ref 70–99)
Potassium: 4.1 mEq/L (ref 3.5–5.1)
Sodium: 141 mEq/L (ref 135–145)
Total Bilirubin: 0.5 mg/dL (ref 0.2–1.2)
Total Protein: 7 g/dL (ref 6.0–8.3)

## 2020-09-02 LAB — LIPID PANEL
Cholesterol: 170 mg/dL (ref 0–200)
HDL: 65.4 mg/dL (ref >39.00)
LDL Cholesterol: 89 mg/dL (ref 0–99)
NonHDL: 104.68
Total CHOL/HDL Ratio: 3
Triglycerides: 77 mg/dL (ref 0.0–149.0)
VLDL: 15.4 mg/dL (ref 0.0–40.0)

## 2020-09-02 MED ORDER — LORAZEPAM 0.5 MG PO TABS: 0.5000 mg | ORAL_TABLET | Freq: Two times a day (BID) | ORAL | 5 refills | Status: DC

## 2020-09-02 NOTE — Progress Notes (Signed)
OFFICE VISIT  09/02/2020  CC:  Chief Complaint  Patient presents with   Follow-up    RCI, pt is fasting   HPI:    Patient is a 75 y.o. Caucasian female who presents for 6 mo f/u HTN, HLD, CRI III, and anxiety/depression. A/P as of last visit: ""1) HTN: poor control. Noncompliant with maxzide b/c of side effect. Will start amlodipine '10mg'$  qd and continue toprol xl '50mg'$  qd. Lytes/cr today.   2) HLD: tolerating '20mg'$  atorva qd. FLP and hepatic panel today.   3) CRI III: she'll make sure when she goes home to check to see if she has meloxicam---I advised her to try not taking this. Needs to improve clear fluid intake. Lytes/cr today.   4) GAD: cont lexapro '20mg'$  qd and loraz qd-bid prn. CSC renewed today.   5) Health maintenance exam: Reviewed age and gender appropriate health maintenance issues (prudent diet, regular exercise, health risks of tobacco and excessive alcohol, use of seatbelts, fire alarms in home, use of sunscreen).  Also reviewed age and gender appropriate health screening as well as vaccine recommendations. Vaccines:  Flu->given today.  Covid->still needs to get booster. Shingrix->#1 today.  Otherwise ALL UTD. Labs: fasting HP labs today. Cervical ca screening: remote hx of TAH/BSO->not a candidate for any further cerv ca screening. Breast ca screening: June 2020 mammogram normal.  She'll arrange repeat. Colon ca screening:  TCS this year, no adenomatous polyps: no further colon ca screening indicated. Osteoporosis screening: pt has declined DEXA screening for this."  INTERIM HX: She is active, not sedentary. Diet: 2 meals a day and eats what she wants.   HTN: amlodipine '10mg'$  added to her toprol xl 50 qd brought bp into normal last visit 5 mo ago.  Trying to eat lower Na diet. Some mild swelling in LL's when on her feet a lot, goes down overnight.   HLD: LDL 82 and HDL 64 last visit 6 mo ago.  Taking atorvastatin 20 qd.  CRI III: renal function was stable 6  mo ago, lytes normal. Admits she needs to drink more water.  Only occasional dose of NSAID.  Anxiety/mood:  stable, doing well on lexapro 20 qd. Uses loraz once in daytime usually and qhs for sleep.  No adverse effects.  ROS as above, plus--> no fevers, no CP, no SOB, no wheezing, no cough, no dizziness, no HAs, no rashes, no melena/hematochezia.  No polyuria or polydipsia.  No myalgias or arthralgias.  No focal weakness, paresthesias, or tremors.  No acute vision or hearing abnormalities.  No dysuria or unusual/new urinary urgency or frequency.  No recent changes in lower legs. No n/v/d or abd pain.  No palpitations.      PMP AWARE reviewed today: most recent rx for lorazepam 0.'5mg'$  was filled 08/02/20, # 52, rx by me. No red flags.    Past Medical History:  Diagnosis Date   Allergic rhinitis    Allergy    seasonal   Anxiety and depression    Arthritis    Osteoarthritis   Cataract    not ripe yet.   Chronic renal insufficiency, stage 3 (moderate) (HCC)    baseline GFR mid 40s as of 2020   GERD (gastroesophageal reflux disease)    + hx of reflux esophagitis   Hepatic steatosis 2012   no signif elevation of LFTs in the past. Stable on u/s 12/2018.   History of acoustic neuroma 1993   left; resected by Dr. Darrold Span at [facility]   Hyperlipidemia  2012   statin in the past, but prior pcp d/c'd it b/c pt's sister was dx'd with cirrhosis from NASH.  Pt's abd u/s showed hepatic steatosis 12/2018.  Great response to atorva started 12/2018.   Hypertension    Obstructive sleep apnea    remote past--intol of CPAP->?effort? to get assistance.  Referred for re-eval 05/2018.   Periumbilical mass [id number]   u/s 12/2018, indeterminate->CT abd showed this was a benign lipoma.   Sleep apnea    no using CPAP    Past Surgical History:  Procedure Laterality Date   ACOUSTIC NEUROMA RESECTION Left 1993    [facility]). Chronic L ear hearing loss.   BREAST EXCISIONAL BIOPSY Left 1980s   COLONOSCOPY   2000; 06/18/2009; 08/2019   1 small polyp NONADENOMATOUS- 08/2019.   TOTAL ABDOMINAL HYSTERECTOMY W/ BILATERAL SALPINGOOPHORECTOMY  REMOTE PAST   nonmalignant reason    Outpatient Medications Prior to Visit  Medication Sig Dispense Refill   amLODipine (NORVASC) 10 MG tablet Take 1 tablet (10 mg total) by mouth daily. 90 tablet 3   aspirin 81 MG tablet Take 81 mg by mouth daily.     atorvastatin (LIPITOR) 20 MG tablet Take 1 tablet by mouth once daily 90 tablet 0   escitalopram (LEXAPRO) 20 MG tablet Take 1 tablet (20 mg total) by mouth daily. 90 tablet 3   famotidine (PEPCID) 40 MG tablet Take 1 tablet (40 mg total) by mouth daily. 30 tablet 11   Fexofenadine HCl (ALLEGRA PO) Take by mouth.     metoprolol succinate (TOPROL-XL) 50 MG 24 hr tablet TAKE 1 TABLET BY MOUTH ONCE DAILY TAKE  WITH  OR  IMMEDIATELY  FOLLOWING  A  MEAL 90 tablet 0   Misc Natural Products (MEGA ENERGY PO) Take by mouth.     montelukast (SINGULAIR) 10 MG tablet TAKE 1 TABLET BY MOUTH AT BEDTIME 90 tablet 0   Multiple Vitamins-Minerals (EMERGEN-C FIVE PO) Take by mouth.     OVER THE COUNTER MEDICATION Take 1 capsule by mouth daily. 1 Tumeric capsule daily     LORazepam (ATIVAN) 0.5 MG tablet Take 1 tablet by mouth twice daily 60 tablet 5   No facility-administered medications prior to visit.    No Known Allergies  ROS As per HPI  PE: Vitals with BMI 09/02/2020 03/05/2020 02/06/2020  Height 5' 1.5" - 5' 1.5"  Weight 172 lbs 3 oz 172 lbs 10 oz 177 lbs  BMI 32.01 Q000111Q [id number]  Systolic 99991111 Q000111Q [id number]  Diastolic 76 80 84  Pulse 75 79 64   Gen: Alert, well appearing.  Patient is oriented to person, place, time, and situation. AFFECT: pleasant, lucid thought and speech. CV: RRR, no m/r/g.   LUNGS: CTA bilat, nonlabored resps, good aeration in all lung fields. EXT: no clubbing or cyanosis.  1+ pitting edema R LL, trace pitting edema L LL. No skin changes.    LABS:  Lab Results  Component Value Date   TSH 1.59  02/06/2020   Lab Results  Component Value Date   WBC 5.8 02/06/2020   HGB 13.3 02/06/2020   HCT 40.4 02/06/2020   MCV 93.5 02/06/2020   PLT 190.0 02/06/2020   Lab Results  Component Value Date   CREATININE 0.99 02/06/2020   BUN 19 02/06/2020   NA 140 02/06/2020   K 4.0 02/06/2020   CL 103 02/06/2020   CO2 30 02/06/2020   Lab Results  Component Value Date   ALT 31 02/06/2020   AST  30 02/06/2020   ALKPHOS 80 02/06/2020   BILITOT 0.7 02/06/2020   Lab Results  Component Value Date   CHOL 166 02/06/2020   Lab Results  Component Value Date   HDL 63.80 02/06/2020   Lab Results  Component Value Date   LDLCALC 82 02/06/2020   Lab Results  Component Value Date   TRIG 101.0 02/06/2020   Lab Results  Component Value Date   CHOLHDL 3 02/06/2020   Lab Results  Component Value Date   HGBA1C 5.7 06/08/2016   IMPRESSION AND PLAN:  1) HTN: stable.  Cont amlod 10 qd and toprol xl 50 qd. Lytes/cr today.  2) HLD: tolerating atorva 20 qd. FLP and hepatic panel today.  3) CRI III: avoids regular use of NSAIDs. She continues to try to improve water intake. Lytes/cr today.  4) GAD, hx of depression: stable/remission. Cont lexapro 20 qd and ativan 0.'5mg'$  qhs prn. New for loraz 0.'5mg'$ , 1 bid prn, #60, RF x5.  An After Visit Summary was printed and given to the patient.  FOLLOW UP: Return in about 6 months (around 03/05/2021) for annual CPE (fasting).  Signed:  Crissie Sickles, MD           09/02/2020

## 2020-09-11 ENCOUNTER — Other Ambulatory Visit: Payer: Self-pay | Admitting: Family Medicine

## 2020-10-19 ENCOUNTER — Other Ambulatory Visit: Payer: Self-pay | Admitting: Family Medicine

## 2020-10-25 ENCOUNTER — Other Ambulatory Visit: Payer: Self-pay | Admitting: Family Medicine

## 2020-10-31 ENCOUNTER — Telehealth: Payer: Self-pay | Admitting: Family Medicine

## 2020-10-31 NOTE — Telephone Encounter (Signed)
Left message for patient to schedule Annual Wellness Visit.  Please schedule with Nurse Health Advisor Julie Greer, RN at [facility]. Please call [phone] ask for Kathy  

## 2021-01-16 ENCOUNTER — Other Ambulatory Visit: Payer: Self-pay | Admitting: Family Medicine

## 2021-01-22 ENCOUNTER — Other Ambulatory Visit: Payer: Self-pay

## 2021-01-22 ENCOUNTER — Ambulatory Visit
Admission: EM | Admit: 2021-01-22 | Discharge: 2021-01-22 | Disposition: A | Discharge status: Home / Self Care | Payer: PPO | Attending: Family Medicine | Admitting: Family Medicine

## 2021-01-22 ENCOUNTER — Ambulatory Visit (INDEPENDENT_AMBULATORY_CARE_PROVIDER_SITE_OTHER): Payer: PPO

## 2021-01-22 DIAGNOSIS — R059 Cough, unspecified: Secondary | ICD-10-CM

## 2021-01-22 DIAGNOSIS — R051 Acute cough: Secondary | ICD-10-CM

## 2021-01-22 DIAGNOSIS — R062 Wheezing: Secondary | ICD-10-CM

## 2021-01-22 MED ORDER — PREDNISONE 20 MG PO TABS: 40.0000 mg | ORAL_TABLET | Freq: Every day | ORAL | 0 refills | Status: DC

## 2021-01-22 MED ORDER — BENZONATATE 100 MG PO CAPS: ORAL_CAPSULE | ORAL | 0 refills | Status: DC

## 2021-01-22 MED ORDER — AZITHROMYCIN 250 MG PO TABS: 250.0000 mg | ORAL_TABLET | Freq: Every day | ORAL | 0 refills | Status: DC

## 2021-01-22 NOTE — ED Provider Notes (Signed)
[facility]   [id number] 01/22/21 Arrival Time: 6789  ASSESSMENT & PLAN:  1. Acute cough   2. Wheezing    I have personally viewed the imaging studies ordered this visit. Bronchitic changes. No PNA.  OTC symptom care as needed. Begin: Meds ordered this encounter  Medications   azithromycin (ZITHROMAX) 250 MG tablet    Sig: Take 1 tablet (250 mg total) by mouth daily. Take first 2 tablets together, then 1 every day until finished.    Dispense:  6 tablet    Refill:  0   predniSONE (DELTASONE) 20 MG tablet    Sig: Take 2 tablets (40 mg total) by mouth daily.    Dispense:  10 tablet    Refill:  0   benzonatate (TESSALON) 100 MG capsule    Sig: Take 1 capsule by mouth every 8 (eight) hours for cough.    Dispense:  21 capsule    Refill:  0     Follow-up Information     McGowen, Adrian Blackwater, MD.   Specialty: Family Medicine Why: If worsening or failing to improve as anticipated. Contact information: [address] [place] [address] [phone]                 Reviewed expectations re: course of current medical issues. Questions answered. Outlined signs and symptoms indicating need for more acute intervention. Understanding verbalized. After Visit Summary given.   SUBJECTIVE: History from: patient. Joan Mahoney is a 75 y.o. female who reports: persistent productive cough; x 2 weeks; with wheezing. Denies: fever and difficulty breathing. Normal PO intake without n/v/d.  Social History   Tobacco Use  Smoking Status Former  Smokeless Tobacco Never    OBJECTIVE:  Vitals:   01/22/21 1227  BP: (!) 142/88  Pulse: 89  Resp: 20  Temp: 99.4 F (37.4 C)  TempSrc: Oral  SpO2: 91%    General appearance: alert; no distress Eyes: PERRLA; EOMI; conjunctiva normal HENT: [place]; AT; without significant nasal congestion Neck: supple  Lungs: speaks full sentences without difficulty; unlabored; deep wet cough; bilat exp wheezing present; no  resp distress Extremities: no edema Skin: warm and dry Neurologic: normal gait Psychological: alert and cooperative; normal mood and affect  Imaging: DG Chest 2 View  Result Date: 01/22/2021 CLINICAL DATA:  Cough for 2 weeks. EXAM: CHEST - 2 VIEW COMPARISON:  Radiographs 07/07/2012. FINDINGS: The heart size and mediastinal contours are stable with aortic atherosclerosis. There is chronic central airway thickening without hyperinflation, confluent airspace opacity, pleural effusion or pneumothorax. There are degenerative changes in the spine and both shoulders. No acute osseous findings are evident. IMPRESSION: Stable findings of chronic obstructive pulmonary disease/bronchitis. No acute cardiopulmonary process identified. Electronically Signed   By: Richardean Sale M.D.   On: 01/22/2021 12:50    No Known Allergies  Past Medical History:  Diagnosis Date   Allergic rhinitis    Anxiety and depression    Cataract    not ripe yet.   Chronic renal insufficiency, stage 3 (moderate) (HCC)    baseline GFR mid 40s as of 2020   GERD (gastroesophageal reflux disease)    + hx of reflux esophagitis   Hepatic steatosis 2012   no signif elevation of LFTs in the past. Stable on u/s 12/2018.   History of acoustic neuroma 1993   left; resected by Dr. Darrold Span at [facility]   Hyperlipidemia 2012   statin in the past, but prior pcp d/c'd it b/c pt's  sister was dx'd with cirrhosis from NASH.  Pt's abd u/s showed hepatic steatosis 12/2018.  Great response to atorva started 12/2018.   Hypertension    Obstructive sleep apnea    remote past--intol of CPAP->?effort? to get assistance.  Referred for re-eval 05/2018.   Osteoarthritis, multiple sites    Periumbilical mass 75/9163   u/s 12/2018, indeterminate->CT abd showed this was a benign lipoma.   Social History   Socioeconomic History   Marital status: Married    Spouse name: Not on file   Number of children: Not on file   Years of education: Not on file    Highest education level: Not on file  Occupational History   Not on file  Tobacco Use   Smoking status: Former   Smokeless tobacco: Never  Vaping Use   Vaping Use: Never used  Substance and Sexual Activity   Alcohol use: Never   Drug use: Never   Sexual activity: Not on file  Other Topics Concern   Not on file  Social History Narrative   Married, lives in [place].   I see her husband Jeneen Rinks.   No T/A/Ds.   Social Determinants of Health   Financial Resource Strain: Not on file  Food Insecurity: Not on file  Transportation Needs: Not on file  Physical Activity: Not on file  Stress: Not on file  Social Connections: Not on file  Intimate Partner Violence: Not on file   Family History  Problem Relation Age of Onset   Breast cancer Cousin    Diabetes type II Sister    Cirrhosis Sister        Non-alcoholic   Colon cancer Neg Hx    Colon polyps Neg Hx    Esophageal cancer Neg Hx    Rectal cancer Neg Hx    Stomach cancer Neg Hx    Past Surgical History:  Procedure Laterality Date   ACOUSTIC NEUROMA RESECTION Left 1993    ([facility]). Chronic L ear hearing loss.   BREAST EXCISIONAL BIOPSY Left 1980s   COLONOSCOPY  2000; 06/18/2009; 08/2019   1 small polyp NONADENOMATOUS- 08/2019.   TOTAL ABDOMINAL HYSTERECTOMY W/ BILATERAL SALPINGOOPHORECTOMY  REMOTE PAST   nonmalignant reason     Vanessa Kick, MD 01/22/21 1309

## 2021-01-22 NOTE — ED Triage Notes (Signed)
Pt reports cough and chest tightness when coughing  x 2 weeks. Robitussin and Mucinex gives no relief.

## 2021-01-26 ENCOUNTER — Other Ambulatory Visit: Payer: Self-pay | Admitting: Family Medicine

## 2021-01-30 ENCOUNTER — Other Ambulatory Visit: Payer: Self-pay | Admitting: Family Medicine

## 2021-01-30 ENCOUNTER — Telehealth: Payer: Self-pay

## 2021-01-30 NOTE — Telephone Encounter (Signed)
Called to schedule AWV with Health Coach

## 2021-02-04 ENCOUNTER — Other Ambulatory Visit: Payer: Self-pay | Admitting: Family Medicine

## 2021-02-26 ENCOUNTER — Ambulatory Visit (INDEPENDENT_AMBULATORY_CARE_PROVIDER_SITE_OTHER): Payer: PPO

## 2021-02-26 DIAGNOSIS — Z0000 Encounter for general adult medical examination without abnormal findings: Secondary | ICD-10-CM

## 2021-02-26 DIAGNOSIS — Z1231 Encounter for screening mammogram for malignant neoplasm of breast: Secondary | ICD-10-CM

## 2021-02-26 DIAGNOSIS — E2839 Other primary ovarian failure: Secondary | ICD-10-CM | POA: Diagnosis not present

## 2021-02-26 NOTE — Progress Notes (Addendum)
Virtual Visit via Telephone Note  I connected with  Joan Mahoney on 02/26/21 at  1:00 PM EST by telephone and verified that I am speaking with the correct person using two identifiers.  Medicare Annual Wellness visit completed telephonically due to Covid-19 pandemic.   Persons participating in this call: This Health Coach and this patient.   Location: Patient: Home Provider: Office    I discussed the limitations, risks, security and privacy concerns of performing an evaluation and management service by telephone and the availability of in person appointments. The patient expressed understanding and agreed to proceed.  Unable to perform video visit due to video visit attempted and failed and/or patient does not have video capability.   Some vital signs may be absent or patient reported.   Willette Brace, LPN   Subjective:   Joan Mahoney is a 76 y.o. female who presents for an Initial Medicare Annual Wellness Visit.  Review of Systems     Cardiac Risk Factors include: advanced age (>20men, >25 women);hypertension;obesity (BMI >30kg/m2)     Objective:    There were no vitals filed for this visit. There is no height or weight on file to calculate BMI.  Advanced Directives 02/26/2021  Does Patient Have a Medical Advance Directive? No  Would patient like information on creating a medical advance directive? Yes (MAU/Ambulatory/Procedural Areas - Information given)    Current Medications (verified) Outpatient Encounter Medications as of 02/26/2021  Medication Sig   amLODipine (NORVASC) 10 MG tablet Take 1 tablet (10 mg total) by mouth daily.   aspirin 81 MG tablet Take 81 mg by mouth daily.   atorvastatin (LIPITOR) 20 MG tablet Take 1 tablet by mouth once daily   benzonatate (TESSALON) 100 MG capsule Take 1 capsule by mouth every 8 (eight) hours for cough.   escitalopram (LEXAPRO) 20 MG tablet Take 1 tablet by mouth once daily   famotidine (PEPCID) 40 MG tablet Take 1  tablet by mouth once daily   LORazepam (ATIVAN) 0.5 MG tablet Take 1 tablet (0.5 mg total) by mouth 2 (two) times daily.   metoprolol succinate (TOPROL-XL) 50 MG 24 hr tablet TAKE 1 TABLET BY MOUTH ONCE DAILY TAKE  WITH  OR  IMMEDIATELY  FOLLOWING  A  MEAL   montelukast (SINGULAIR) 10 MG tablet TAKE 1 TABLET BY MOUTH AT BEDTIME   Multiple Vitamins-Minerals (EMERGEN-C FIVE PO) Take by mouth.   OVER THE COUNTER MEDICATION Take 1 capsule by mouth daily. 1 Tumeric capsule daily   Fexofenadine HCl (ALLEGRA PO) Take by mouth. (Patient not taking: Reported on 02/26/2021)   [DISCONTINUED] azithromycin (ZITHROMAX) 250 MG tablet Take 1 tablet (250 mg total) by mouth daily. Take first 2 tablets together, then 1 every day until finished.   [DISCONTINUED] Misc Natural Products (MEGA ENERGY PO) Take by mouth. (Patient not taking: Reported on 02/26/2021)   [DISCONTINUED] predniSONE (DELTASONE) 20 MG tablet Take 2 tablets (40 mg total) by mouth daily.   No facility-administered encounter medications on file as of 02/26/2021.    Allergies (verified) Patient has no known allergies.   History: Past Medical History:  Diagnosis Date   Allergic rhinitis    Anxiety and depression    Cataract    not ripe yet.   Chronic renal insufficiency, stage 3 (moderate) (HCC)    baseline GFR mid 40s as of 2020   GERD (gastroesophageal reflux disease)    + hx of reflux esophagitis   Hepatic steatosis 2012   no signif  elevation of LFTs in the past. Stable on u/s 12/2018.   History of acoustic neuroma 1993   left; resected by Dr. Darrold Span at [facility]   Hyperlipidemia 2012   statin in the past, but prior pcp d/c'd it b/c pt's sister was dx'd with cirrhosis from NASH.  Pt's abd u/s showed hepatic steatosis 12/2018.  Great response to atorva started 12/2018.   Hypertension    Obstructive sleep apnea    remote past--intol of CPAP->?effort? to get assistance.  Referred for re-eval 05/2018.   Osteoarthritis, multiple sites     Periumbilical mass [id number]/9381   u/s 12/2018, indeterminate->CT abd showed this was a benign lipoma.   Past Surgical History:  Procedure Laterality Date   ACOUSTIC NEUROMA RESECTION Left 1993    [facility]). Chronic L ear hearing loss.   BREAST EXCISIONAL BIOPSY Left 1980s   COLONOSCOPY  2000; 06/18/2009; 08/2019   1 small polyp NONADENOMATOUS- 08/2019.   TOTAL ABDOMINAL HYSTERECTOMY W/ BILATERAL SALPINGOOPHORECTOMY  REMOTE PAST   nonmalignant reason   Family History  Problem Relation Age of Onset   Breast cancer Cousin    Diabetes type II Sister    Cirrhosis Sister        Non-alcoholic   Colon cancer Neg Hx    Colon polyps Neg Hx    Esophageal cancer Neg Hx    Rectal cancer Neg Hx    Stomach cancer Neg Hx    Social History   Socioeconomic History   Marital status: Married    Spouse name: Not on file   Number of children: Not on file   Years of education: Not on file   Highest education level: Not on file  Occupational History   Not on file  Tobacco Use   Smoking status: Former   Smokeless tobacco: Never  Vaping Use   Vaping Use: Never used  Substance and Sexual Activity   Alcohol use: Never   Drug use: Never   Sexual activity: Not on file  Other Topics Concern   Not on file  Social History Narrative   Married, lives in [place].   I see her husband Jeneen Rinks.   No T/A/Ds.   Social Determinants of Health   Financial Resource Strain: Low Risk    Difficulty of Paying Living Expenses: Not hard at all  Food Insecurity: No Food Insecurity   Worried About Charity fundraiser in the Last Year: Never true   Circle in the Last Year: Never true  Transportation Needs: No Transportation Needs   Lack of Transportation (Medical): No   Lack of Transportation (Non-Medical): No  Physical Activity: Inactive   Days of Exercise per Week: 0 days   Minutes of Exercise per Session: 0 min  Stress: No Stress Concern Present   Feeling of Stress : Only a little  Social Connections:  Moderately Integrated   Frequency of Communication with Friends and Family: More than three times a week   Frequency of Social Gatherings with Friends and Family: More than three times a week   Attends Religious Services: 1 to 4 times per year   Active Member of [employer] or Organizations: No   Attends Archivist Meetings: Never   Marital Status: Married    Tobacco Counseling Counseling given: Not Answered   Clinical Intake:  Pre-visit preparation completed: Yes  Pain : No/denies pain     BMI - recorded: 32.01 Nutritional Status: BMI > 30  Obese Nutritional Risks: None Diabetes: No  How  often do you need to have someone help you when you read instructions, pamphlets, or other written materials from your doctor or pharmacy?: 1 - Never  Diabetic?no  Interpreter Needed?: No  Information entered by :: Charlott Rakes, LPN   Activities of Daily Living In your present state of health, do you have any difficulty performing the following activities: 02/26/2021  Hearing? Y  Comment left ear deaf  Vision? N  Difficulty concentrating or making decisions? N  Walking or climbing stairs? N  Dressing or bathing? N  Doing errands, shopping? N  Preparing Food and eating ? N  Using the Toilet? N  In the past six months, have you accidently leaked urine? Y  Comment a accident at times  Do you have problems with loss of bowel control? N  Managing your Medications? N  Managing your Finances? N  Housekeeping or managing your Housekeeping? N  Some recent data might be hidden    Patient Care Team: Tammi Sou, MD as PCP - General (Family Medicine) Armbruster, Carlota Raspberry, MD as Consulting Physician (Gastroenterology)  Indicate any recent Medical Services you may have received from other than [facility] providers in the past year (date may be approximate).     Assessment:   This is a routine wellness examination for [place].  Hearing/Vision screen Hearing Screening -  Comments:: deaf in left ear  Vision Screening - Comments:: Pt follows up with dr Valetta Close for annual eye exams   Dietary issues and exercise activities discussed: Current Exercise Habits: The patient does not participate in regular exercise at present   Goals Addressed             This Visit's Progress    Patient Stated       Lose weight        Depression Screen PHQ 2/9 Scores 02/26/2021 09/02/2020 06/06/2019 12/07/2018 05/23/2018 12/09/2015 07/03/2014  PHQ - 2 Score 1 0 0 1 1 0 0  PHQ- 9 Score - - 0 3 - - -    Fall Risk Fall Risk  02/26/2021 09/02/2020 06/06/2019 05/23/2018 12/09/2015  Falls in the past year? 1 0 0 0 No  Number falls in past yr: 1 0 0 0 -  Injury with Fall? 1 0 0 0 -  Comment soreness from tripping over object - - - -  Risk for fall due to : Impaired vision;Impaired balance/gait - - - -  Follow up Falls prevention discussed Falls evaluation completed Falls evaluation completed Falls evaluation completed -    FALL RISK PREVENTION PERTAINING TO THE HOME:  Any stairs in or around the home? Yes  If so, are there any without handrails? No  Home free of loose throw rugs in walkways, pet beds, electrical cords, etc? Yes  Adequate lighting in your home to reduce risk of falls? Yes   ASSISTIVE DEVICES UTILIZED TO PREVENT FALLS:  Life alert? No  Use of a cane, walker or w/c? No  Grab bars in the bathroom? Yes  Shower chair or bench in shower? No  Elevated toilet seat or a handicapped toilet? No   TIMED UP AND GO:  Was the test performed? No .  Cognitive Function:     6CIT Screen 02/26/2021  What Year? 0 points  What month? 0 points  What time? 0 points  Count back from 20 0 points  Months in reverse 0 points  Repeat phrase 2 points  Total Score 2    Immunizations Immunization History  Administered Date(s) Administered  Fluad Quad(high Dose 65+) 10/12/2018, 02/06/2020   Influenza Split 12/10/2012   Influenza Whole 02/10/2004, 12/11/2006, 12/09/2007    Influenza, High Dose Seasonal PF 12/09/2015, 12/08/2016   Influenza-Unspecified 11/22/2017   Moderna Sars-Covid-2 Vaccination 04/05/2019, 05/03/2019   Pneumococcal Conjugate-13 04/26/2013   Pneumococcal Polysaccharide-23 02/15/2012   Td 02/10/2000   Tdap 09/22/2010   Zoster Recombinat (Shingrix) 02/06/2020   Zoster, Live 07/13/2014    TDAP status: Due, Education has been provided regarding the importance of this vaccine. Advised may receive this vaccine at local pharmacy or Health Dept. Aware to provide a copy of the vaccination record if obtained from local pharmacy or Health Dept. Verbalized acceptance and understanding.  Flu Vaccine status: Due, Education has been provided regarding the importance of this vaccine. Advised may receive this vaccine at local pharmacy or Health Dept. Aware to provide a copy of the vaccination record if obtained from local pharmacy or Health Dept. Verbalized acceptance and understanding.  Pneumococcal vaccine status: Up to date  Covid-19 vaccine status: Completed vaccines  Qualifies for Shingles Vaccine? Yes   Zostavax completed Yes   Shingrix Completed?: No.    Education has been provided regarding the importance of this vaccine. Patient has been advised to call insurance company to determine out of pocket expense if they have not yet received this vaccine. Advised may also receive vaccine at local pharmacy or Health Dept. Verbalized acceptance and understanding.  Pt has had 1st dose   Screening Tests Health Maintenance  Topic Date Due   DEXA SCAN  Never done   COVID-19 Vaccine (3 - Moderna risk series) 05/31/2019   Zoster Vaccines- Shingrix (2 of 2) 04/02/2020   MAMMOGRAM  07/25/2020   INFLUENZA VACCINE  09/09/2020   TETANUS/TDAP  09/21/2020   COLONOSCOPY (Pts 45-36yrs Insurance coverage will need to be confirmed)  08/15/2029   Pneumonia Vaccine 13+ Years old  Completed   Hepatitis C Screening  Completed   HPV VACCINES  Aged Out    Health  Maintenance  Health Maintenance Due  Topic Date Due   DEXA SCAN  Never done   COVID-19 Vaccine (3 - Moderna risk series) 05/31/2019   Zoster Vaccines- Shingrix (2 of 2) 04/02/2020   MAMMOGRAM  07/25/2020   INFLUENZA VACCINE  09/09/2020   TETANUS/TDAP  09/21/2020    Colorectal cancer screening: Type of screening: Colonoscopy. Completed 08/16/19. Repeat every 10 years  Mammogram status: Ordered 02/26/21. Pt provided with contact info and advised to call to schedule appt.   Bone Density status: Ordered 02/26/21. Pt provided with contact info and advised to call to schedule appt.  Additional Screening:  Hepatitis C Screening: Completed 04/05/09  Vision Screening: Recommended annual ophthalmology exams for early detection of glaucoma and other disorders of the eye. Is the patient up to date with their annual eye exam?  Yes  Who is the provider or what is the name of the office in which the patient attends annual eye exams? Dr Valetta Close If pt is not established with a provider, would they like to be referred to a provider to establish care? No .   Dental Screening: Recommended annual dental exams for proper oral hygiene  Community Resource Referral / Chronic Care Management: CRR required this visit?  No   CCM required this visit?  No      Plan:     I have personally reviewed and noted the following in the patients chart:   Medical and social history Use of alcohol, tobacco or illicit drugs  Current medications and supplements including opioid prescriptions. Patient is not currently taking opioid prescriptions. Functional ability and status Nutritional status Physical activity Advanced directives List of other physicians Hospitalizations, surgeries, and ER visits in previous 12 months Vitals Screenings to include cognitive, depression, and falls Referrals and appointments  In addition, I have reviewed and discussed with patient certain preventive protocols, quality metrics, and  best practice recommendations. A written personalized care plan for preventive services as well as general preventive health recommendations were provided to patient.     Willette Brace, LPN   10/03/537   Nurse Notes: Juluis Rainier pt stated that she has noticed she is still wheezing from being sick in December she stated that she has completed zpak and prednisone treatment. Pt has an already scheduled appt with you on 03/05/21 please advise

## 2021-02-26 NOTE — Patient Instructions (Signed)
Joan Mahoney , Thank you for taking time to come for your Medicare Wellness Visit. I appreciate your ongoing commitment to your health goals. Please review the following plan we discussed and let me know if I can assist you in the future.   Screening recommendations/referrals: Colonoscopy: Done 08/16/19 repeat every 10 years  Mammogram: order placed 02/26/21 Bone Density: order placed 02/26/21 Recommended yearly ophthalmology/optometry visit for glaucoma screening and checkup Recommended yearly dental visit for hygiene and checkup  Vaccinations: Influenza vaccine: Due and discussed Pneumococcal vaccine: Up to date Tdap vaccine: Due and discussed Shingles vaccine: Done 02/06/20   Covid-19:Completed 2/24, 05/03/19  Advanced directives: Advance directive discussed with you today. I have provided a copy for you to complete at home and have notarized. Once this is complete please bring a copy in to our office so we can scan it into your chart.   Conditions/risks identified: lose weight   Next appointment: Follow up in one year for your annual wellness visit    Preventive Care 65 Years and Older, Female Preventive care refers to lifestyle choices and visits with your health care provider that can promote health and wellness. What does preventive care include? A yearly physical exam. This is also called an annual well check. Dental exams once or twice a year. Routine eye exams. Ask your health care provider how often you should have your eyes checked. Personal lifestyle choices, including: Daily care of your teeth and gums. Regular physical activity. Eating a healthy diet. Avoiding tobacco and drug use. Limiting alcohol use. Practicing safe sex. Taking low-dose aspirin every day. Taking vitamin and mineral supplements as recommended by your health care provider. What happens during an annual well check? The services and screenings done by your health care provider during your annual well  check will depend on your age, overall health, lifestyle risk factors, and family history of disease. Counseling  Your health care provider may ask you questions about your: Alcohol use. Tobacco use. Drug use. Emotional well-being. Home and relationship well-being. Sexual activity. Eating habits. History of falls. Memory and ability to understand (cognition). Work and work Statistician. Reproductive health. Screening  You may have the following tests or measurements: Height, weight, and BMI. Blood pressure. Lipid and cholesterol levels. These may be checked every 5 years, or more frequently if you are over 28 years old. Skin check. Lung cancer screening. You may have this screening every year starting at age 25 if you have a 30-pack-year history of smoking and currently smoke or have quit within the past 15 years. Fecal occult blood test (FOBT) of the stool. You may have this test every year starting at age 52. Flexible sigmoidoscopy or colonoscopy. You may have a sigmoidoscopy every 5 years or a colonoscopy every 10 years starting at age 21. Hepatitis C blood test. Hepatitis B blood test. Sexually transmitted disease (STD) testing. Diabetes screening. This is done by checking your blood sugar (glucose) after you have not eaten for a while (fasting). You may have this done every 1-3 years. Bone density scan. This is done to screen for osteoporosis. You may have this done starting at age 52. Mammogram. This may be done every 1-2 years. Talk to your health care provider about how often you should have regular mammograms. Talk with your health care provider about your test results, treatment options, and if necessary, the need for more tests. Vaccines  Your health care provider may recommend certain vaccines, such as: Influenza vaccine. This is recommended every year.  Tetanus, diphtheria, and acellular pertussis (Tdap, Td) vaccine. You may need a Td booster every 10 years. Zoster  vaccine. You may need this after age 14. Pneumococcal 13-valent conjugate (PCV13) vaccine. One dose is recommended after age 70. Pneumococcal polysaccharide (PPSV23) vaccine. One dose is recommended after age 40. Talk to your health care provider about which screenings and vaccines you need and how often you need them. This information is not intended to replace advice given to you by your health care provider. Make sure you discuss any questions you have with your health care provider. Document Released: 02/22/2015 Document Revised: 10/16/2015 Document Reviewed: 11/27/2014 Elsevier Interactive Patient Education  2017 [employer] Prevention in the Home Falls can cause injuries. They can happen to people of all ages. There are many things you can do to make your home safe and to help prevent falls. What can I do on the outside of my home? Regularly fix the edges of walkways and driveways and fix any cracks. Remove anything that might make you trip as you walk through a door, such as a raised step or threshold. Trim any bushes or trees on the path to your home. Use bright outdoor lighting. Clear any walking paths of anything that might make someone trip, such as rocks or tools. Regularly check to see if handrails are loose or broken. Make sure that both sides of any steps have handrails. Any raised decks and porches should have guardrails on the edges. Have any leaves, snow, or ice cleared regularly. Use sand or salt on walking paths during winter. Clean up any spills in your garage right away. This includes oil or grease spills. What can I do in the bathroom? Use night lights. Install grab bars by the toilet and in the tub and shower. Do not use towel bars as grab bars. Use non-skid mats or decals in the tub or shower. If you need to sit down in the shower, use a plastic, non-slip stool. Keep the floor dry. Clean up any water that spills on the floor as soon as it happens. Remove  soap buildup in the tub or shower regularly. Attach bath mats securely with double-sided non-slip rug tape. Do not have throw rugs and other things on the floor that can make you trip. What can I do in the bedroom? Use night lights. Make sure that you have a light by your bed that is easy to reach. Do not use any sheets or blankets that are too big for your bed. They should not hang down onto the floor. Have a firm chair that has side arms. You can use this for support while you get dressed. Do not have throw rugs and other things on the floor that can make you trip. What can I do in the kitchen? Clean up any spills right away. Avoid walking on wet floors. Keep items that you use a lot in easy-to-reach places. If you need to reach something above you, use a strong step stool that has a grab bar. Keep electrical cords out of the way. Do not use floor polish or wax that makes floors slippery. If you must use wax, use non-skid floor wax. Do not have throw rugs and other things on the floor that can make you trip. What can I do with my stairs? Do not leave any items on the stairs. Make sure that there are handrails on both sides of the stairs and use them. Fix handrails that are broken or loose.  Make sure that handrails are as long as the stairways. Check any carpeting to make sure that it is firmly attached to the stairs. Fix any carpet that is loose or worn. Avoid having throw rugs at the top or bottom of the stairs. If you do have throw rugs, attach them to the floor with carpet tape. Make sure that you have a light switch at the top of the stairs and the bottom of the stairs. If you do not have them, ask someone to add them for you. What else can I do to help prevent falls? Wear shoes that: Do not have high heels. Have rubber bottoms. Are comfortable and fit you well. Are closed at the toe. Do not wear sandals. If you use a stepladder: Make sure that it is fully opened. Do not climb a  closed stepladder. Make sure that both sides of the stepladder are locked into place. Ask someone to hold it for you, if possible. Clearly mark and make sure that you can see: Any grab bars or handrails. First and last steps. Where the edge of each step is. Use tools that help you move around (mobility aids) if they are needed. These include: Canes. Walkers. Scooters. Crutches. Turn on the lights when you go into a dark area. Replace any light bulbs as soon as they burn out. Set up your furniture so you have a clear path. Avoid moving your furniture around. If any of your floors are uneven, fix them. If there are any pets around you, be aware of where they are. Review your medicines with your doctor. Some medicines can make you feel dizzy. This can increase your chance of falling. Ask your doctor what other things that you can do to help prevent falls. This information is not intended to replace advice given to you by your health care provider. Make sure you discuss any questions you have with your health care provider. Document Released: 11/22/2008 Document Revised: 07/04/2015 Document Reviewed: 03/02/2014 Elsevier Interactive Patient Education  2017 [employer].

## 2021-03-04 DIAGNOSIS — H2513 Age-related nuclear cataract, bilateral: Secondary | ICD-10-CM | POA: Diagnosis not present

## 2021-03-05 ENCOUNTER — Other Ambulatory Visit: Payer: Self-pay

## 2021-03-05 ENCOUNTER — Encounter: Payer: Self-pay | Admitting: Family Medicine

## 2021-03-05 ENCOUNTER — Ambulatory Visit (INDEPENDENT_AMBULATORY_CARE_PROVIDER_SITE_OTHER): Payer: PPO | Admitting: Family Medicine

## 2021-03-05 VITALS — BP 125/76 | HR 74 | Temp 97.7°F | Ht 63.0 in | Wt 176.2 lb

## 2021-03-05 DIAGNOSIS — Z23 Encounter for immunization: Secondary | ICD-10-CM

## 2021-03-05 DIAGNOSIS — R062 Wheezing: Secondary | ICD-10-CM | POA: Diagnosis not present

## 2021-03-05 DIAGNOSIS — I10 Essential (primary) hypertension: Secondary | ICD-10-CM

## 2021-03-05 DIAGNOSIS — N1831 Chronic kidney disease, stage 3a: Secondary | ICD-10-CM | POA: Diagnosis not present

## 2021-03-05 DIAGNOSIS — E7800 Pure hypercholesterolemia, unspecified: Secondary | ICD-10-CM | POA: Diagnosis not present

## 2021-03-05 DIAGNOSIS — Z8659 Personal history of other mental and behavioral disorders: Secondary | ICD-10-CM | POA: Diagnosis not present

## 2021-03-05 DIAGNOSIS — Z0000 Encounter for general adult medical examination without abnormal findings: Secondary | ICD-10-CM

## 2021-03-05 DIAGNOSIS — F411 Generalized anxiety disorder: Secondary | ICD-10-CM | POA: Diagnosis not present

## 2021-03-05 DIAGNOSIS — Z79899 Other long term (current) drug therapy: Secondary | ICD-10-CM | POA: Diagnosis not present

## 2021-03-05 LAB — COMPREHENSIVE METABOLIC PANEL
ALT: 29 U/L (ref 0–35)
AST: 27 U/L (ref 0–37)
Albumin: 4.4 g/dL (ref 3.5–5.2)
Alkaline Phosphatase: 99 U/L (ref 39–117)
BUN: 24 mg/dL — ABNORMAL HIGH (ref 6–23)
CO2: 31 mEq/L (ref 19–32)
Calcium: 9.5 mg/dL (ref 8.4–10.5)
Chloride: 103 mEq/L (ref 96–112)
Creatinine, Ser: 0.92 mg/dL (ref 0.40–1.20)
GFR: 61.46 mL/min (ref >60.00)
Glucose, Bld: 93 mg/dL (ref 70–99)
Potassium: 4.2 mEq/L (ref 3.5–5.1)
Sodium: 139 mEq/L (ref 135–145)
Total Bilirubin: 0.6 mg/dL (ref 0.2–1.2)
Total Protein: 7.2 g/dL (ref 6.0–8.3)

## 2021-03-05 LAB — CBC WITH DIFFERENTIAL/PLATELET
Basophils Absolute: 0.1 10*3/uL (ref 0.0–0.1)
Basophils Relative: 1.5 % (ref 0.0–3.0)
Eosinophils Absolute: 0.4 10*3/uL (ref 0.0–0.7)
Eosinophils Relative: 6.2 % — ABNORMAL HIGH (ref 0.0–5.0)
HCT: 42.8 % (ref 36.0–46.0)
Hemoglobin: 13.9 g/dL (ref 12.0–15.0)
Lymphocytes Relative: 24.6 % (ref 12.0–46.0)
Lymphs Abs: 1.6 10*3/uL (ref 0.7–4.0)
MCHC: 32.5 g/dL (ref 30.0–36.0)
MCV: 91 fl (ref 78.0–100.0)
Monocytes Absolute: 0.5 10*3/uL (ref 0.1–1.0)
Monocytes Relative: 7.1 % (ref 3.0–12.0)
Neutro Abs: 3.9 10*3/uL (ref 1.4–7.7)
Neutrophils Relative %: 60.6 % (ref 43.0–77.0)
Platelets: 240 10*3/uL (ref 150.0–400.0)
RBC: 4.7 Mil/uL (ref 3.87–5.11)
RDW: 14.1 % (ref 11.5–15.5)
WBC: 6.4 10*3/uL (ref 4.0–10.5)

## 2021-03-05 LAB — LIPID PANEL
Cholesterol: 173 mg/dL (ref 0–200)
HDL: 69.7 mg/dL (ref >39.00)
LDL Cholesterol: 83 mg/dL (ref 0–99)
NonHDL: 103.02
Total CHOL/HDL Ratio: 2
Triglycerides: 101 mg/dL (ref 0.0–149.0)
VLDL: 20.2 mg/dL (ref 0.0–40.0)

## 2021-03-05 MED ORDER — LORAZEPAM 0.5 MG PO TABS: 0.5000 mg | ORAL_TABLET | Freq: Two times a day (BID) | ORAL | 5 refills | Status: DC

## 2021-03-05 MED ORDER — METOPROLOL SUCCINATE ER 50 MG PO TB24: ORAL_TABLET | ORAL | 3 refills | Status: DC

## 2021-03-05 MED ORDER — ZOSTER VAC RECOMB ADJUVANTED 50 MCG/0.5ML IM SUSR: 0.5000 mL | Freq: Once | INTRAMUSCULAR | 0 refills | Status: AC

## 2021-03-05 MED ORDER — TETANUS-DIPHTH-ACELL PERTUSSIS 5-2-15.5 LF-MCG/0.5 IM SUSP: 0.5000 mL | Freq: Once | INTRAMUSCULAR | 0 refills | Status: AC

## 2021-03-05 NOTE — Progress Notes (Signed)
Office Note 03/05/2021  CC:  Chief Complaint  Patient presents with   Annual Exam    HPI:  Patient is a 76 y.o. female who is here for annual health maintenance exam and 6 mo f/u HTN, HLD, CRI III, and anx/dep--high risk med use. A/P as of last visit: "1) HTN: stable.  Cont amlod 10 qd and toprol xl 50 qd. Lytes/cr today.   2) HLD: tolerating atorva 20 qd. FLP and hepatic panel today.   3) CRI III: avoids regular use of NSAIDs. She continues to try to improve water intake. Lytes/cr today.   4) GAD, hx of depression: stable/remission. Cont lexapro 20 qd and ativan 0.5mg  qhs prn. New for loraz 0.5mg , 1 bid prn, #60, RF x5.  INTERIM HX: She is feeling well. Still adjusting to the relatively recent death of her family dog.  Also, her grandson is in rehab.  She continues on Lexapro 20 mg/day as well as lorazepam 0.5 mg twice daily.  Home blood pressures typically around 166-063 systolic, less than 80 diastolic.  Heart rate typically 70s  She had a significant cough/respiratory illness about 2 months ago.  She went to an urgent care and was prescribed prednisone and antibiotics.  She has only an occasional cough now but says she hears wheezing and there is noises from her upper airway. No shortness of breath, no chest tightness, no fevers.  PMP AWARE reviewed today: most recent rx for lorazepam 0.5mg  was filled 02/04/21, # 67, rx by me. No red flags.  Past Medical History:  Diagnosis Date   Allergic rhinitis    Anxiety and depression    Cataract    not ripe yet.   Chronic renal insufficiency, stage 3 (moderate) (HCC)    baseline GFR mid 40s as of 2020   GERD (gastroesophageal reflux disease)    + hx of reflux esophagitis   Hepatic steatosis 2012   no signif elevation of LFTs in the past. Stable on u/s 12/2018.   History of acoustic neuroma 1993   left; resected by Dr. Darrold Span at [facility]   Hyperlipidemia 2012   statin in the past, but prior pcp d/c'd it b/c pt's  sister was dx'd with cirrhosis from NASH.  Pt's abd u/s showed hepatic steatosis 12/2018.  Great response to atorva started 12/2018.   Hypertension    Obstructive sleep apnea    remote past--intol of CPAP->?effort? to get assistance.  Referred for re-eval 05/2018.   Osteoarthritis, multiple sites    Periumbilical mass 02/6008   u/s 12/2018, indeterminate->CT abd showed this was a benign lipoma.    Past Surgical History:  Procedure Laterality Date   ACOUSTIC NEUROMA RESECTION Left 1993    [facility]). Chronic L ear hearing loss.   BREAST EXCISIONAL BIOPSY Left 1980s   COLONOSCOPY  2000; 06/18/2009; 08/2019   1 small polyp NONADENOMATOUS- 08/2019.   TOTAL ABDOMINAL HYSTERECTOMY W/ BILATERAL SALPINGOOPHORECTOMY  REMOTE PAST   nonmalignant reason    Family History  Problem Relation Age of Onset   Breast cancer Cousin    Diabetes type II Sister    Cirrhosis Sister        Non-alcoholic   Colon cancer Neg Hx    Colon polyps Neg Hx    Esophageal cancer Neg Hx    Rectal cancer Neg Hx    Stomach cancer Neg Hx     Social History   Socioeconomic History   Marital status: Married    Spouse name: Not on file  Number of children: Not on file   Years of education: Not on file   Highest education level: Not on file  Occupational History   Not on file  Tobacco Use   Smoking status: Former   Smokeless tobacco: Never  Vaping Use   Vaping Use: Never used  Substance and Sexual Activity   Alcohol use: Never   Drug use: Never   Sexual activity: Not on file  Other Topics Concern   Not on file  Social History Narrative   Married, lives in [place].   I see her husband Jeneen Rinks.   No T/A/Ds.   Social Determinants of Health   Financial Resource Strain: Low Risk    Difficulty of Paying Living Expenses: Not hard at all  Food Insecurity: No Food Insecurity   Worried About Charity fundraiser in the Last Year: Never true   [place] in the Last Year: Never true  Transportation Needs:  No Transportation Needs   Lack of Transportation (Medical): No   Lack of Transportation (Non-Medical): No  Physical Activity: Inactive   Days of Exercise per Week: 0 days   Minutes of Exercise per Session: 0 min  Stress: No Stress Concern Present   Feeling of Stress : Only a little  Social Connections: Moderately Integrated   Frequency of Communication with Friends and Family: More than three times a week   Frequency of Social Gatherings with Friends and Family: More than three times a week   Attends Religious Services: 1 to 4 times per year   Active Member of [employer] or Organizations: No   Attends Archivist Meetings: Never   Marital Status: Married  Human resources officer Violence: Not At Risk   Fear of Current or Ex-Partner: No   Emotionally Abused: No   Physically Abused: No   Sexually Abused: No    Outpatient Medications Prior to Visit  Medication Sig Dispense Refill   amLODipine (NORVASC) 10 MG tablet Take 1 tablet (10 mg total) by mouth daily. 90 tablet 3   aspirin 81 MG tablet Take 81 mg by mouth daily.     atorvastatin (LIPITOR) 20 MG tablet Take 1 tablet by mouth once daily 90 tablet 0   escitalopram (LEXAPRO) 20 MG tablet Take 1 tablet by mouth once daily 90 tablet 0   famotidine (PEPCID) 40 MG tablet Take 1 tablet by mouth once daily 90 tablet 3   Fexofenadine HCl (ALLEGRA PO) Take by mouth.     montelukast (SINGULAIR) 10 MG tablet TAKE 1 TABLET BY MOUTH AT BEDTIME 90 tablet 3   Multiple Vitamins-Minerals (EMERGEN-C FIVE PO) Take by mouth.     OVER THE COUNTER MEDICATION Take 1 capsule by mouth daily. 1 Tumeric capsule daily     LORazepam (ATIVAN) 0.5 MG tablet Take 1 tablet (0.5 mg total) by mouth 2 (two) times daily. 60 tablet 5   metoprolol succinate (TOPROL-XL) 50 MG 24 hr tablet TAKE 1 TABLET BY MOUTH ONCE DAILY TAKE  WITH  OR  IMMEDIATELY  FOLLOWING  A  MEAL 90 tablet 1   benzonatate (TESSALON) 100 MG capsule Take 1 capsule by mouth every 8 (eight) hours for  cough. (Patient not taking: Reported on 03/05/2021) 21 capsule 0   No facility-administered medications prior to visit.    No Known Allergies  ROS Review of Systems  Constitutional:  Negative for appetite change, chills, fatigue and fever.  HENT:  Negative for congestion, dental problem, ear pain and sore throat.  Eyes:  Negative for discharge, redness and visual disturbance.  Respiratory:  Negative for chest tightness, shortness of breath and wheezing. Cough: upper airway "wheeze".  Cardiovascular:  Negative for chest pain, palpitations and leg swelling.  Gastrointestinal:  Negative for abdominal pain, blood in stool, diarrhea, nausea and vomiting.  Genitourinary:  Negative for difficulty urinating, dysuria, flank pain, frequency, hematuria and urgency.  Musculoskeletal:  Negative for arthralgias, back pain, joint swelling, myalgias and neck stiffness.  Skin:  Negative for pallor and rash.  Neurological:  Negative for dizziness, speech difficulty, weakness and headaches.  Hematological:  Negative for adenopathy. Does not bruise/bleed easily.  Psychiatric/Behavioral:  Negative for confusion and sleep disturbance. The patient is not nervous/anxious.    PE; Vitals with BMI 03/05/2021 01/22/2021 09/02/2020  Height 5\' 3"  - 5' 1.5"  Weight 176 lbs 3 oz - 172 lbs 3 oz  BMI 79.89 - 21.19  Systolic 417 408 144  Diastolic 76 88 76  Pulse 74 89 75  Exam chaperoned by Deveron Furlong, CMA.  Gen: Alert, well appearing.  Patient is oriented to person, place, time, and situation. AFFECT: pleasant, lucid thought and speech. ENT: Ears: EACs clear, normal epithelium.  TMs with good light reflex and landmarks bilaterally.  Eyes: no injection, icteris, swelling, or exudate.  EOMI, PERRLA. Nose: no drainage or turbinate edema/swelling.  No injection or focal lesion.  Mouth: lips without lesion/swelling.  Oral mucosa pink and moist.  Dentition intact and without obvious caries or gingival swelling.   Oropharynx without erythema, exudate, or swelling.  Neck: supple/nontender.  No LAD, mass, or TM.  Carotid pulses 2+ bilaterally, without bruits. CV: RRR, no m/r/g.   LUNGS: CTA bilat, nonlabored resps, good aeration in all lung fields. ABD: soft, NT, ND, BS normal.  No hepatospenomegaly or mass.  No bruits. EXT: no clubbing, cyanosis, or edema.  Musculoskeletal: no joint swelling, erythema, warmth, or tenderness.  ROM of all joints intact. Skin - no sores or suspicious lesions or rashes or color changes  Pertinent labs:  Lab Results  Component Value Date   TSH 1.59 02/06/2020   Lab Results  Component Value Date   WBC 5.8 02/06/2020   HGB 13.3 02/06/2020   HCT 40.4 02/06/2020   MCV 93.5 02/06/2020   PLT 190.0 02/06/2020   Lab Results  Component Value Date   CREATININE 1.09 09/02/2020   BUN 29 (H) 09/02/2020   NA 141 09/02/2020   K 4.1 09/02/2020   CL 104 09/02/2020   CO2 28 09/02/2020   Lab Results  Component Value Date   ALT 23 09/02/2020   AST 23 09/02/2020   ALKPHOS 80 09/02/2020   BILITOT 0.5 09/02/2020   Lab Results  Component Value Date   CHOL 170 09/02/2020   Lab Results  Component Value Date   HDL 65.40 09/02/2020   Lab Results  Component Value Date   LDLCALC 89 09/02/2020   Lab Results  Component Value Date   TRIG 77.0 09/02/2020   Lab Results  Component Value Date   CHOLHDL 3 09/02/2020   Lab Results  Component Value Date   HGBA1C 5.7 06/08/2016   ASSESSMENT AND PLAN:   1) upper airway wheezing. I think she has some residual mucus collection/inflammation around the larynx.  Reassured.  2.  Hypertension, not ideal control. Increase Toprol-XL to 1-1/2 of the 50 mg tabs daily.  Continue amlodipine 10 mg daily.  3.  Hyperlipidemia.  Tolerating atorvastatin 20 mg a day well. LDL goal around 100  or less.  LDL was 89 about 6 months ago. Lipid panel and hepatic panel today.  #4 chronic renal insufficiency stage III: Avoids NSAIDs.  Works on  Financial planner well. Electrolytes and creatinine today.  #5 chronic anxiety, history of depression.  Recent adjustment disorder with mixed anxiety, depressed mood.  She is pulling through this fine at this time. Continue Lexapro 20 mg a day and lorazepam 0.5 mg twice daily as needed  6. Health maintenance exam: Reviewed age and gender appropriate health maintenance issues (prudent diet, regular exercise, health risks of tobacco and excessive alcohol, use of seatbelts, fire alarms in home, use of sunscreen).  Also reviewed age and gender appropriate health screening as well as vaccine recommendations. Vaccines:  Flu->given today.  Tdap->rx sent to pharm.  Shingrix #2-->rx sent to pharm0.  Otherwise all utd. Labs: cbc, cmet, flp. Cervical ca screening: remote hx of TAH/BSO->not a candidate for any further cerv ca screening. Breast ca screening: This has been ordered--she just needs to call to set this up [facility]--bre[facility]). Colon ca screening:  TCS this year, no adenomatous polyps: no further colon ca screening indicated. Osteoporosis screening: has been ordered.  An After Visit Summary was printed and given to the patient.  FOLLOW UP:  Return in about 4 weeks (around 04/02/2021) for f/u BP.  Signed:  Crissie Sickles, MD           03/05/2021

## 2021-03-05 NOTE — Patient Instructions (Signed)
Increase your toprol xl (metoprolol) to 1 and 1/2 of the 50mg  tabs daily.  I sent in new prescription.   Health Maintenance, Female Adopting a healthy lifestyle and getting preventive care are important in promoting health and wellness. Ask your health care provider about: The right schedule for you to have regular tests and exams. Things you can do on your own to prevent diseases and keep yourself healthy. What should I know about diet, weight, and exercise? Eat a healthy diet  Eat a diet that includes plenty of vegetables, fruits, low-fat dairy products, and lean protein. Do not eat a lot of foods that are high in solid fats, added sugars, or sodium. Maintain a healthy weight Body mass index (BMI) is used to identify weight problems. It estimates body fat based on height and weight. Your health care provider can help determine your BMI and help you achieve or maintain a healthy weight. Get regular exercise Get regular exercise. This is one of the most important things you can do for your health. Most adults should: Exercise for at least 150 minutes each week. The exercise should increase your heart rate and make you sweat (moderate-intensity exercise). Do strengthening exercises at least twice a week. This is in addition to the moderate-intensity exercise. Spend less time sitting. Even light physical activity can be beneficial. Watch cholesterol and blood lipids Have your blood tested for lipids and cholesterol at 75 years of age, then have this test every 5 years. Have your cholesterol levels checked more often if: Your lipid or cholesterol levels are high. You are older than 75 years of age. You are at high risk for heart disease. What should I know about cancer screening? Depending on your health history and family history, you may need to have cancer screening at various ages. This may include screening for: Breast cancer. Cervical cancer. Colorectal cancer. Skin cancer. Lung  cancer. What should I know about heart disease, diabetes, and high blood pressure? Blood pressure and heart disease High blood pressure causes heart disease and increases the risk of stroke. This is more likely to develop in people who have high blood pressure readings or are overweight. Have your blood pressure checked: Every 3-5 years if you are 78-41 years of age. Every year if you are 11 years old or older. Diabetes Have regular diabetes screenings. This checks your fasting blood sugar level. Have the screening done: Once every three years after age 27 if you are at a normal weight and have a low risk for diabetes. More often and at a younger age if you are overweight or have a high risk for diabetes. What should I know about preventing infection? Hepatitis B If you have a higher risk for hepatitis B, you should be screened for this virus. Talk with your health care provider to find out if you are at risk for hepatitis B infection. Hepatitis C Testing is recommended for: Everyone born from [birth year] through 1964. Anyone with known risk factors for hepatitis C. Sexually transmitted infections (STIs) Get screened for STIs, including gonorrhea and chlamydia, if: You are sexually active and are younger than 75 years of age. You are older than 75 years of age and your health care provider tells you that you are at risk for this type of infection. Your sexual activity has changed since you were last screened, and you are at increased risk for chlamydia or gonorrhea. Ask your health care provider if you are at risk. Ask your health care provider  about whether you are at high risk for HIV. Your health care provider may recommend a prescription medicine to help prevent HIV infection. If you choose to take medicine to prevent HIV, you should first get tested for HIV. You should then be tested every 3 months for as long as you are taking the medicine. Pregnancy If you are about to stop having your  period (premenopausal) and you may become pregnant, seek counseling before you get pregnant. Take 400 to 800 micrograms (mcg) of folic acid every day if you become pregnant. Ask for birth control (contraception) if you want to prevent pregnancy. Osteoporosis and menopause Osteoporosis is a disease in which the bones lose minerals and strength with aging. This can result in bone fractures. If you are 2 years old or older, or if you are at risk for osteoporosis and fractures, ask your health care provider if you should: Be screened for bone loss. Take a calcium or vitamin D supplement to lower your risk of fractures. Be given hormone replacement therapy (HRT) to treat symptoms of menopause. Follow these instructions at home: Alcohol use Do not drink alcohol if: Your health care provider tells you not to drink. You are pregnant, may be pregnant, or are planning to become pregnant. If you drink alcohol: Limit how much you have to: 0-1 drink a day. Know how much alcohol is in your drink. In the [place] one drink equals one 12 oz bottle of beer (355 mL), one 5 oz glass of wine (148 mL), or one 1 oz glass of hard liquor (44 mL). Lifestyle Do not use any products that contain nicotine or tobacco. These products include cigarettes, chewing tobacco, and vaping devices, such as e-cigarettes. If you need help quitting, ask your health care provider. Do not use street drugs. Do not share needles. Ask your health care provider for help if you need support or information about quitting drugs. General instructions Schedule regular health, dental, and eye exams. Stay current with your vaccines. Tell your health care provider if: You often feel depressed. You have ever been abused or do not feel safe at home. Summary Adopting a healthy lifestyle and getting preventive care are important in promoting health and wellness. Follow your health care provider's instructions about healthy diet, exercising, and  getting tested or screened for diseases. Follow your health care provider's instructions on monitoring your cholesterol and blood pressure. This information is not intended to replace advice given to you by your health care provider. Make sure you discuss any questions you have with your health care provider. Document Revised: 06/17/2020 Document Reviewed: 06/17/2020 Elsevier Patient Education  [place].

## 2021-03-10 ENCOUNTER — Other Ambulatory Visit: Payer: Self-pay | Admitting: Family Medicine

## 2021-03-10 DIAGNOSIS — E2839 Other primary ovarian failure: Secondary | ICD-10-CM

## 2021-04-01 ENCOUNTER — Other Ambulatory Visit: Payer: Self-pay

## 2021-04-01 ENCOUNTER — Ambulatory Visit
Admission: RE | Admit: 2021-04-01 | Discharge: 2021-04-01 | Disposition: A | Discharge status: Home / Self Care | Payer: PPO | Source: Ambulatory Visit | Attending: Family Medicine | Admitting: Family Medicine

## 2021-04-01 DIAGNOSIS — Z1231 Encounter for screening mammogram for malignant neoplasm of breast: Secondary | ICD-10-CM

## 2021-04-02 ENCOUNTER — Ambulatory Visit: Payer: PPO | Admitting: Family Medicine

## 2021-04-07 ENCOUNTER — Other Ambulatory Visit: Payer: Self-pay

## 2021-04-07 ENCOUNTER — Ambulatory Visit (INDEPENDENT_AMBULATORY_CARE_PROVIDER_SITE_OTHER): Payer: PPO | Admitting: Family Medicine

## 2021-04-07 ENCOUNTER — Encounter: Payer: Self-pay | Admitting: Family Medicine

## 2021-04-07 VITALS — BP 128/82 | HR 66 | Temp 97.6°F | Ht 63.0 in | Wt 177.2 lb

## 2021-04-07 DIAGNOSIS — F411 Generalized anxiety disorder: Secondary | ICD-10-CM

## 2021-04-07 DIAGNOSIS — R400 Somnolence: Secondary | ICD-10-CM

## 2021-04-07 DIAGNOSIS — I10 Essential (primary) hypertension: Secondary | ICD-10-CM | POA: Diagnosis not present

## 2021-04-07 DIAGNOSIS — E7800 Pure hypercholesterolemia, unspecified: Secondary | ICD-10-CM

## 2021-04-07 MED ORDER — ATORVASTATIN CALCIUM 20 MG PO TABS: 20.0000 mg | ORAL_TABLET | Freq: Every day | ORAL | 3 refills | Status: DC

## 2021-04-07 NOTE — Progress Notes (Signed)
OFFICE VISIT  04/07/2021  CC:  Chief Complaint  Patient presents with   Hypertension    Has not checked BP this morning, brought BP readings   Hyperlipidemia    fasting   Anxiety   HPI:    Patient is a 76 y.o. female who presents for 1 mo f/u uncontrolled HTN. A/P as of last visit: "1) upper airway wheezing. I think she has some residual mucus collection/inflammation around the larynx.  Reassured.  2.  Hypertension, not ideal control. Increase Toprol-XL to 1-1/2 of the 50 mg tabs daily.  Continue amlodipine 10 mg daily.  3.  Hyperlipidemia.  Tolerating atorvastatin 20 mg a day well. LDL goal around 100 or less.  LDL was 89 about 6 months ago. Lipid panel and hepatic panel today.   #4 chronic renal insufficiency stage III: Avoids NSAIDs.  Works on Financial planner well. Electrolytes and creatinine today.   #5 chronic anxiety, history of depression.  Recent adjustment disorder with mixed anxiety, depressed mood.  She is pulling through this fine at this time. Continue Lexapro 20 mg a day and lorazepam 0.5 mg twice daily as needed   6. Health maintenance exam: Reviewed age and gender appropriate health maintenance issues (prudent diet, regular exercise, health risks of tobacco and excessive alcohol, use of seatbelts, fire alarms in home, use of sunscreen).  Also reviewed age and gender appropriate health screening as well as vaccine recommendations. Vaccines:  Flu->given today.  Tdap->rx sent to pharm.  Shingrix #2-->rx sent to pharm.  Otherwise all utd. Labs: cbc, cmet, flp. Cervical ca screening: remote hx of TAH/BSO->not a candidate for any further cerv ca screening. Breast ca screening: This has been ordered--she just needs to call to set this up [facility] imaging--breast center). Colon ca screening:  TCS this year, no adenomatous polyps: no further colon ca screening indicated. Osteoporosis screening: has been ordered."  INTERIM HX: All labs excellent last visit. She is  feeling well except thinks maybe she is more tired on the higher dose of metoprolol. She admits she is not as active as she should be and this may contribute to her fatigue. Additionally, she has been diagnosed with sleep apnea in the remote past and could not tolerate CPAP.  Home bp monitoring:  she brought in 4 recorded measurements 120-142 syst, 66-79 diast, HR 73-78.  Past Medical History:  Diagnosis Date   Allergic rhinitis    Anxiety and depression    Cataract    not ripe yet.   Chronic renal insufficiency, stage 3 (moderate) (HCC)    baseline GFR mid 40s as of 2020   GERD (gastroesophageal reflux disease)    + hx of reflux esophagitis   Hepatic steatosis 2012   no signif elevation of LFTs in the past. Stable on u/s 12/2018.   History of acoustic neuroma 1993   left; resected by Dr. Darrold Span at [facility]   Hyperlipidemia 2012   statin in the past, but prior pcp d/c'd it b/c pt's sister was dx'd with cirrhosis from NASH.  Pt's abd u/s showed hepatic steatosis 12/2018.  Great response to atorva started 12/2018.   Hypertension    Obstructive sleep apnea    remote past--intol of CPAP->?effort? to get assistance.  Referred for re-eval 05/2018.   Osteoarthritis, multiple sites    Periumbilical mass 01/8785   u/s 12/2018, indeterminate->CT abd showed this was a benign lipoma.    Past Surgical History:  Procedure Laterality Date   ACOUSTIC NEUROMA RESECTION Left 1993    (  [facility]). Chronic L ear hearing loss.   BREAST EXCISIONAL BIOPSY Left 1980s   COLONOSCOPY  2000; 06/18/2009; 08/2019   1 small polyp NONADENOMATOUS- 08/2019.   TOTAL ABDOMINAL HYSTERECTOMY W/ BILATERAL SALPINGOOPHORECTOMY  REMOTE PAST   nonmalignant reason    Outpatient Medications Prior to Visit  Medication Sig Dispense Refill   amLODipine (NORVASC) 10 MG tablet Take 1 tablet (10 mg total) by mouth daily. 90 tablet 3   aspirin 81 MG tablet Take 81 mg by mouth daily.     atorvastatin (LIPITOR) 20 MG tablet Take 1  tablet by mouth once daily 90 tablet 0   escitalopram (LEXAPRO) 20 MG tablet Take 1 tablet by mouth once daily 90 tablet 0   famotidine (PEPCID) 40 MG tablet Take 1 tablet by mouth once daily 90 tablet 3   Fexofenadine HCl (ALLEGRA PO) Take by mouth.     LORazepam (ATIVAN) 0.5 MG tablet Take 1 tablet (0.5 mg total) by mouth 2 (two) times daily. 60 tablet 5   metoprolol succinate (TOPROL-XL) 50 MG 24 hr tablet 1 and 1/2 tabs po qd 135 tablet 3   montelukast (SINGULAIR) 10 MG tablet TAKE 1 TABLET BY MOUTH AT BEDTIME 90 tablet 3   Multiple Vitamins-Minerals (EMERGEN-C FIVE PO) Take by mouth.     OVER THE COUNTER MEDICATION Take 1 capsule by mouth daily. 1 Tumeric capsule daily     No facility-administered medications prior to visit.    No Known Allergies  ROS As per HPI  PE: Vitals with BMI 04/07/2021 03/05/2021 01/22/2021  Height 5\' 3"  5\' 3"  -  Weight 177 lbs 3 oz 176 lbs 3 oz -  BMI 14.4 31.54 -  Systolic 008 676 195  Diastolic 82 76 88  Pulse 66 74 89   Physical Exam  Gen: Alert, well appearing.  Patient is oriented to person, place, time, and situation. AFFECT: pleasant, lucid thought and speech. No further exam today.  LABS:  Last CBC Lab Results  Component Value Date   WBC 6.4 03/05/2021   HGB 13.9 03/05/2021   HCT 42.8 03/05/2021   MCV 91.0 03/05/2021   RDW 14.1 03/05/2021   PLT 240.0 09/32/6712   Last metabolic panel Lab Results  Component Value Date   GLUCOSE 93 03/05/2021   NA 139 03/05/2021   K 4.2 03/05/2021   CL 103 03/05/2021   CO2 31 03/05/2021   BUN 24 (H) 03/05/2021   CREATININE 0.92 03/05/2021   GFRNONAA 59 (L) 05/21/2011   CALCIUM 9.5 03/05/2021   PROT 7.2 03/05/2021   ALBUMIN 4.4 03/05/2021   BILITOT 0.6 03/05/2021   ALKPHOS 99 03/05/2021   AST 27 03/05/2021   ALT 29 03/05/2021   Last lipids Lab Results  Component Value Date   CHOL 173 03/05/2021   HDL 69.70 03/05/2021   LDLCALC 83 03/05/2021   LDLDIRECT 174.8 01/27/2012   TRIG  101.0 03/05/2021   CHOLHDL 2 03/05/2021   Last hemoglobin A1c Lab Results  Component Value Date   HGBA1C 5.7 06/08/2016   Last thyroid functions Lab Results  Component Value Date   TSH 1.59 02/06/2020   IMPRESSION AND PLAN:  #1 hypertension, well controlled now. Continue Toprol XL 50 mg tab, 1-1/2 daily.  Continue amlodipine 10 mg daily.  2.  Chronic fatigue/daytime sleepiness. If she remains active she admits this is not as much of a problem. Not clear if her tiredness is related to her latest small increase in Toprol.  Also there may be some  contribution from antihistamine she takes for environmental allergies. We did discuss her possibly returning for a repeat evaluation of sleep apnea/question of alternative mask for her.  She said she will think about it. No changes for today.  An After Visit Summary was printed and given to the patient.  FOLLOW UP: No follow-ups on file.  Signed:  Crissie Sickles, MD           04/07/2021

## 2021-04-14 ENCOUNTER — Other Ambulatory Visit: Payer: Self-pay | Admitting: Family Medicine

## 2021-04-30 ENCOUNTER — Ambulatory Visit: Payer: PPO

## 2021-05-07 ENCOUNTER — Other Ambulatory Visit: Payer: Self-pay

## 2021-05-07 ENCOUNTER — Ambulatory Visit (INDEPENDENT_AMBULATORY_CARE_PROVIDER_SITE_OTHER): Payer: PPO

## 2021-05-07 ENCOUNTER — Other Ambulatory Visit: Payer: Self-pay | Admitting: Family Medicine

## 2021-05-07 DIAGNOSIS — Z0000 Encounter for general adult medical examination without abnormal findings: Secondary | ICD-10-CM

## 2021-05-07 NOTE — Progress Notes (Signed)
Virtual Visit via Telephone Note ? ?I connected with  Joan Mahoney on 05/07/21 at  3:00 PM EDT by telephone and verified that I am speaking with the correct person using two identifiers. ? ?Medicare Annual Wellness visit completed telephonically due to Covid-19 pandemic.  ? ?Persons participating in this call: This Health Coach and this patient.  ? ?Location: ?Patient: Home ?Provider: Office ?  ?I discussed the limitations, risks, security and privacy concerns of performing an evaluation and management service by telephone and the availability of in person appointments. The patient expressed understanding and agreed to proceed. ? ?Unable to perform video visit due to video visit attempted and failed and/or patient does not have video capability.  ? ?Some vital signs may be absent or patient reported.  ? ?Willette Brace, LPN ? ? ?Subjective:  ? Joan Mahoney is a 76 y.o. female who presents for Medicare Annual (Subsequent) preventive examination. ? ?Review of Systems    ? ?Cardiac Risk Factors include: advanced age (>40mn, >>65women);hypertension;obesity (BMI >30kg/m2) ? ?   ?Objective:  ?  ?There were no vitals filed for this visit. ?There is no height or weight on file to calculate BMI. ? ? ?  05/07/2021  ?  3:08 PM 02/26/2021  ?  1:17 PM  ?Advanced Directives  ?Does Patient Have a Medical Advance Directive? No No  ?Would patient like information on creating a medical advance directive? No - Patient declined Yes (MAU/Ambulatory/Procedural Areas - Information given)  ? ? ?Current Medications (verified) ?Outpatient Encounter Medications as of 05/07/2021  ?Medication Sig  ? amLODipine (NORVASC) 10 MG tablet Take 1 tablet by mouth once daily  ? aspirin 81 MG tablet Take 81 mg by mouth daily.  ? atorvastatin (LIPITOR) 20 MG tablet Take 1 tablet (20 mg total) by mouth daily.  ? escitalopram (LEXAPRO) 20 MG tablet Take 1 tablet by mouth once daily  ? famotidine (PEPCID) 40 MG tablet Take 1 tablet by mouth once  daily  ? Fexofenadine HCl (ALLEGRA PO) Take by mouth.  ? LORazepam (ATIVAN) 0.5 MG tablet Take 1 tablet (0.5 mg total) by mouth 2 (two) times daily.  ? metoprolol succinate (TOPROL-XL) 50 MG 24 hr tablet 1 and 1/2 tabs po qd  ? montelukast (SINGULAIR) 10 MG tablet TAKE 1 TABLET BY MOUTH AT BEDTIME  ? Multiple Vitamins-Minerals (EMERGEN-C FIVE PO) Take by mouth.  ? OVER THE COUNTER MEDICATION Take 1 capsule by mouth daily. 1 Tumeric capsule daily  ? VITAMIN D PO Take by mouth. Citracal  ? [DISCONTINUED] escitalopram (LEXAPRO) 20 MG tablet Take 1 tablet by mouth once daily  ? ?No facility-administered encounter medications on file as of 05/07/2021.  ? ? ?Allergies (verified) ?Patient has no known allergies.  ? ?History: ?Past Medical History:  ?Diagnosis Date  ? Allergic rhinitis   ? Anxiety and depression   ? Cataract   ? not ripe yet.  ? Chronic renal insufficiency, stage 3 (moderate) (HCC)   ? baseline GFR mid 40s as of 2020  ? GERD (gastroesophageal reflux disease)   ? + hx of reflux esophagitis  ? Hepatic steatosis 2012  ? no signif elevation of LFTs in the past. Stable on u/s 12/2018.  ? History of acoustic neuroma 1993  ? left; [facility] ? Hyperlipidemia 2012  ? statin in the past, but prior pcp d/c'd it b/c pt's sister was dx'd with cirrhosis from NASH.  Pt's abd u/s showed hepatic steatosis 12/2018.  Great response to atorva started 12/2018.  ? Hypertension   ? Obstructive sleep apnea   ? remote past--intol of CPAP->?effort? to get assistance.  Referred for re-eval 05/2018.  ? Osteoarthritis, multiple sites   ? Periumbilical mass [id number]  ? u/s 12/2018, indeterminate->CT abd showed this was a benign lipoma.  ? ?Past Surgical History:  ?Procedure Laterality Date  ? ACOUSTIC NEUROMA RESECTION Left 1993  ?  ([facility]). Chronic L ear hearing loss.  ? BREAST EXCISIONAL BIOPSY Left 1980s  ? COLONOSCOPY  2000; 06/18/2009; 08/2019  ? 1 small polyp NONADENOMATOUS- 08/2019.  ? TOTAL ABDOMINAL  HYSTERECTOMY W/ BILATERAL SALPINGOOPHORECTOMY  REMOTE PAST  ? nonmalignant reason  ? ?Family History  ?Problem Relation Age of Onset  ? Breast cancer Cousin   ? Diabetes type II Sister   ? Cirrhosis Sister   ?     Non-alcoholic  ? Colon cancer Neg Hx   ? Colon polyps Neg Hx   ? Esophageal cancer Neg Hx   ? Rectal cancer Neg Hx   ? Stomach cancer Neg Hx   ? ?Social History  ? ?Socioeconomic History  ? Marital status: Married  ?  Spouse name: Not on file  ? Number of children: Not on file  ? Years of education: Not on file  ? Highest education level: 12th grade  ?Occupational History  ? Not on file  ?Tobacco Use  ? Smoking status: Former  ? Smokeless tobacco: Never  ?Vaping Use  ? Vaping Use: Never used  ?Substance and Sexual Activity  ? Alcohol use: Never  ? Drug use: Never  ? Sexual activity: Not on file  ?Other Topics Concern  ? Not on file  ?Social History Narrative  ? Married, lives in [place].  ? I see her husband Jeneen Rinks.  ? No T/A/Ds.  ? ?Social Determinants of Health  ? ?Financial Resource Strain: Low Risk   ? Difficulty of Paying Living Expenses: Not hard at all  ?Food Insecurity: No Food Insecurity  ? Worried About Charity fundraiser in the Last Year: Never true  ? Ran Out of Food in the Last Year: Never true  ?Transportation Needs: No Transportation Needs  ? Lack of Transportation (Medical): No  ? Lack of Transportation (Non-Medical): No  ?Physical Activity: Inactive  ? Days of Exercise per Week: 0 days  ? Minutes of Exercise per Session: 0 min  ?Stress: No Stress Concern Present  ? Feeling of Stress : Not at all  ?Social Connections: Moderately Isolated  ? Frequency of Communication with Friends and Family: Three times a week  ? Frequency of Social Gatherings with Friends and Family: Once a week  ? Attends Religious Services: Never  ? Active Member of Clubs or Organizations: No  ? Attends Archivist Meetings: Never  ? Marital Status: Married  ? ? ?Tobacco Counseling ?Counseling given: Not  Answered ? ? ?Clinical Intake: ? ?Pre-visit preparation completed: Yes ? ?Pain : No/denies pain ? ?  ? ?BMI - recorded: 31.4 ?Nutritional Status: BMI > 30  Obese ?Nutritional Risks: None ?Diabetes: No ? ?How often do you need to have someone help you when you read instructions, pamphlets, or other written materials from your doctor or pharmacy?: 1 - Never ? ?Diabetic?no ? ?Interpreter Needed?: No ? ?Information entered by :: Charlott Rakes, LPN ? ? ?Activities of Daily Living ? ?  05/07/2021  ?  3:10 PM 02/26/2021  ?  1:19 PM  ?In your present state of health, do you  have any difficulty performing the following activities:  ?Hearing? 1 1  ?Comment left ear deaf left ear deaf  ?Vision? 0 0  ?Difficulty concentrating or making decisions? 0 0  ?Walking or climbing stairs? 0 0  ?Dressing or bathing? 0 0  ?Doing errands, shopping? 0 0  ?Preparing Food and eating ? N N  ?Using the Toilet? N N  ?In the past six months, have you accidently leaked urine? Y Y  ?Comment at times a accident at times  ?Do you have problems with loss of bowel control? N N  ?Managing your Medications? N N  ?Managing your Finances? N N  ?Housekeeping or managing your Housekeeping? N N  ? ? ?Patient Care Team: ?McGowen, Adrian Blackwater, MD as PCP - General (Family Medicine) ?Armbruster, Carlota Raspberry, MD as Consulting Physician (Gastroenterology) ?Jola Schmidt, MD as Consulting Physician (Ophthalmology) ? ?Indicate any recent Medical Services you may have received from other than [facility] providers in the past year (date may be approximate). ? ?   ?Assessment:  ? This is a routine wellness examination for [facility]. ? ?Hearing/Vision screen ?Hearing Screening - Comments:: Pt stated left ear deaf  ?Vision Screening - Comments:: Pt follows up with Dr Carolin Sicks  for annual eye exams  ? ?Dietary issues and exercise activities discussed: ?Current Exercise Habits: The patient does not participate in regular exercise at present ? ? Goals Addressed   ? ?  ?  ?  ?  ?  This Visit's Progress  ?  Patient Stated     ?  Lose weight  ?  ? ?  ? ?Depression Screen ? ?  05/07/2021  ?  3:07 PM 02/26/2021  ?  1:16 PM 09/02/2020  ?  8:49 AM 06/06/2019  ?  9:15 AM 12/07/2018  ? 10:22 AM 05/23/2018  ?

## 2021-05-07 NOTE — Patient Instructions (Signed)
Joan Mahoney , ?Thank you for taking time to come for your Medicare Wellness Visit. I appreciate your ongoing commitment to your health goals. Please review the following plan we discussed and let me know if I can assist you in the future.  ? ?Screening recommendations/referrals: ?Colonoscopy: Done 08/16/19 repeat every 10 years  ?Mammogram: Done 04/01/21 repeat every year  ?Bone Density: Done 07/29/21 scheduled  ?Recommended yearly ophthalmology/optometry visit for glaucoma screening and checkup ?Recommended yearly dental visit for hygiene and checkup ? ?Vaccinations: ?Influenza vaccine: Done 03/05/21 repeat every year  ?Pneumococcal vaccine: Up to date ?Tdap vaccine: Done 03/11/21 repeat every 10 years  ?Shingles vaccine: 02/06/20   ?Covid-19:Completed 2/24, 05/03/19 ? ?Advanced directives: Advance directive discussed with you today. Even though you declined this today please call our office should you change your mind and we can give you the proper paperwork for you to fill out. ? ? ?Conditions/risks identified: lose weight  ? ?Next appointment: Follow up in one year for your annual wellness visit  ? ? ?Preventive Care 5 Years and Older, Female ?Preventive care refers to lifestyle choices and visits with your health care provider that can promote health and wellness. ?What does preventive care include? ?A yearly physical exam. This is also called an annual well check. ?Dental exams once or twice a year. ?Routine eye exams. Ask your health care provider how often you should have your eyes checked. ?Personal lifestyle choices, including: ?Daily care of your teeth and gums. ?Regular physical activity. ?Eating a healthy diet. ?Avoiding tobacco and drug use. ?Limiting alcohol use. ?Practicing safe sex. ?Taking low-dose aspirin every day. ?Taking vitamin and mineral supplements as recommended by your health care provider. ?What happens during an annual well check? ?The services and screenings done by your health care provider  during your annual well check will depend on your age, overall health, lifestyle risk factors, and family history of disease. ?Counseling  ?Your health care provider may ask you questions about your: ?Alcohol use. ?Tobacco use. ?Drug use. ?Emotional well-being. ?Home and relationship well-being. ?Sexual activity. ?Eating habits. ?History of falls. ?Memory and ability to understand (cognition). ?Work and work Statistician. ?Reproductive health. ?Screening  ?You may have the following tests or measurements: ?Height, weight, and BMI. ?Blood pressure. ?Lipid and cholesterol levels. These may be checked every 5 years, or more frequently if you are over 77 years old. ?Skin check. ?Lung cancer screening. You may have this screening every year starting at age 74 if you have a 30-pack-year history of smoking and currently smoke or have quit within the past 15 years. ?Fecal occult blood test (FOBT) of the stool. You may have this test every year starting at age 62. ?Flexible sigmoidoscopy or colonoscopy. You may have a sigmoidoscopy every 5 years or a colonoscopy every 10 years starting at age 37. ?Hepatitis C blood test. ?Hepatitis B blood test. ?Sexually transmitted disease (STD) testing. ?Diabetes screening. This is done by checking your blood sugar (glucose) after you have not eaten for a while (fasting). You may have this done every 1-3 years. ?Bone density scan. This is done to screen for osteoporosis. You may have this done starting at age 65. ?Mammogram. This may be done every 1-2 years. Talk to your health care provider about how often you should have regular mammograms. ?Talk with your health care provider about your test results, treatment options, and if necessary, the need for more tests. ?Vaccines  ?Your health care provider may recommend certain vaccines, such as: ?Influenza vaccine. This  is recommended every year. ?Tetanus, diphtheria, and acellular pertussis (Tdap, Td) vaccine. You may need a Td booster every  10 years. ?Zoster vaccine. You may need this after age 70. ?Pneumococcal 13-valent conjugate (PCV13) vaccine. One dose is recommended after age 10. ?Pneumococcal polysaccharide (PPSV23) vaccine. One dose is recommended after age 35. ?Talk to your health care provider about which screenings and vaccines you need and how often you need them. ?This information is not intended to replace advice given to you by your health care provider. Make sure you discuss any questions you have with your health care provider. ?Document Released: 02/22/2015 Document Revised: 10/16/2015 Document Reviewed: 11/27/2014 ?Elsevier Interactive Patient Education ? 2017 [place]. ? ?Fall Prevention in the Home ?Falls can cause injuries. They can happen to people of all ages. There are many things you can do to make your home safe and to help prevent falls. ?What can I do on the outside of my home? ?Regularly fix the edges of walkways and driveways and fix any cracks. ?Remove anything that might make you trip as you walk through a door, such as a raised step or threshold. ?Trim any bushes or trees on the path to your home. ?Use bright outdoor lighting. ?Clear any walking paths of anything that might make someone trip, such as rocks or tools. ?Regularly check to see if handrails are loose or broken. Make sure that both sides of any steps have handrails. ?Any raised decks and porches should have guardrails on the edges. ?Have any leaves, snow, or ice cleared regularly. ?Use sand or salt on walking paths during winter. ?Clean up any spills in your garage right away. This includes oil or grease spills. ?What can I do in the bathroom? ?Use night lights. ?Install grab bars by the toilet and in the tub and shower. Do not use towel bars as grab bars. ?Use non-skid mats or decals in the tub or shower. ?If you need to sit down in the shower, use a plastic, non-slip stool. ?Keep the floor dry. Clean up any water that spills on the floor as soon as it  happens. ?Remove soap buildup in the tub or shower regularly. ?Attach bath mats securely with double-sided non-slip rug tape. ?Do not have throw rugs and other things on the floor that can make you trip. ?What can I do in the bedroom? ?Use night lights. ?Make sure that you have a light by your bed that is easy to reach. ?Do not use any sheets or blankets that are too big for your bed. They should not hang down onto the floor. ?Have a firm chair that has side arms. You can use this for support while you get dressed. ?Do not have throw rugs and other things on the floor that can make you trip. ?What can I do in the kitchen? ?Clean up any spills right away. ?Avoid walking on wet floors. ?Keep items that you use a lot in easy-to-reach places. ?If you need to reach something above you, use a strong step stool that has a grab bar. ?Keep electrical cords out of the way. ?Do not use floor polish or wax that makes floors slippery. If you must use wax, use non-skid floor wax. ?Do not have throw rugs and other things on the floor that can make you trip. ?What can I do with my stairs? ?Do not leave any items on the stairs. ?Make sure that there are handrails on both sides of the stairs and use them. Fix handrails that  are broken or loose. Make sure that handrails are as long as the stairways. ?Check any carpeting to make sure that it is firmly attached to the stairs. Fix any carpet that is loose or worn. ?Avoid having throw rugs at the top or bottom of the stairs. If you do have throw rugs, attach them to the floor with carpet tape. ?Make sure that you have a light switch at the top of the stairs and the bottom of the stairs. If you do not have them, ask someone to add them for you. ?What else can I do to help prevent falls? ?Wear shoes that: ?Do not have high heels. ?Have rubber bottoms. ?Are comfortable and fit you well. ?Are closed at the toe. Do not wear sandals. ?If you use a stepladder: ?Make sure that it is fully opened.  Do not climb a closed stepladder. ?Make sure that both sides of the stepladder are locked into place. ?Ask someone to hold it for you, if possible. ?Clearly mark and make sure that you can see: ?Any

## 2021-07-29 ENCOUNTER — Ambulatory Visit
Admission: RE | Admit: 2021-07-29 | Discharge: 2021-07-29 | Disposition: A | Discharge status: Home / Self Care | Payer: PPO | Source: Ambulatory Visit | Attending: Family Medicine | Admitting: Family Medicine

## 2021-07-29 DIAGNOSIS — M8589 Other specified disorders of bone density and structure, multiple sites: Secondary | ICD-10-CM | POA: Diagnosis not present

## 2021-07-29 DIAGNOSIS — Z780 Asymptomatic menopausal state: Secondary | ICD-10-CM | POA: Diagnosis not present

## 2021-07-29 DIAGNOSIS — E2839 Other primary ovarian failure: Secondary | ICD-10-CM

## 2021-08-01 ENCOUNTER — Encounter: Payer: Self-pay | Admitting: Family Medicine

## 2021-09-12 ENCOUNTER — Other Ambulatory Visit: Payer: Self-pay | Admitting: Family Medicine

## 2021-09-12 NOTE — Telephone Encounter (Signed)
Requesting: Lorazepam Contract: 02/06/20 UDS: n/a Last Visit: 04/07/21 Next Visit:10/07/21 Last Refill: 03/05/21(60,5)  Please Advise. Med pending

## 2021-09-15 DIAGNOSIS — H524 Presbyopia: Secondary | ICD-10-CM | POA: Diagnosis not present

## 2021-09-15 DIAGNOSIS — H2513 Age-related nuclear cataract, bilateral: Secondary | ICD-10-CM | POA: Diagnosis not present

## 2021-09-15 DIAGNOSIS — D3131 Benign neoplasm of right choroid: Secondary | ICD-10-CM | POA: Diagnosis not present

## 2021-09-29 ENCOUNTER — Other Ambulatory Visit: Payer: Self-pay | Admitting: Family Medicine

## 2021-10-07 ENCOUNTER — Ambulatory Visit: Payer: PPO | Admitting: Family Medicine

## 2021-10-29 ENCOUNTER — Encounter: Payer: Self-pay | Admitting: Family Medicine

## 2021-10-29 ENCOUNTER — Ambulatory Visit (INDEPENDENT_AMBULATORY_CARE_PROVIDER_SITE_OTHER): Payer: PPO | Admitting: Family Medicine

## 2021-10-29 VITALS — BP 117/73 | HR 71 | Temp 98.4°F | Ht 63.0 in | Wt 178.0 lb

## 2021-10-29 DIAGNOSIS — E7800 Pure hypercholesterolemia, unspecified: Secondary | ICD-10-CM | POA: Diagnosis not present

## 2021-10-29 DIAGNOSIS — M19012 Primary osteoarthritis, left shoulder: Secondary | ICD-10-CM

## 2021-10-29 DIAGNOSIS — N1831 Chronic kidney disease, stage 3a: Secondary | ICD-10-CM | POA: Diagnosis not present

## 2021-10-29 DIAGNOSIS — Z23 Encounter for immunization: Secondary | ICD-10-CM

## 2021-10-29 DIAGNOSIS — I10 Essential (primary) hypertension: Secondary | ICD-10-CM | POA: Diagnosis not present

## 2021-10-29 LAB — COMPREHENSIVE METABOLIC PANEL
ALT: 33 U/L (ref 0–35)
AST: 28 U/L (ref 0–37)
Albumin: 4.4 g/dL (ref 3.5–5.2)
Alkaline Phosphatase: 113 U/L (ref 39–117)
BUN: 24 mg/dL — ABNORMAL HIGH (ref 6–23)
CO2: 30 mEq/L (ref 19–32)
Calcium: 10 mg/dL (ref 8.4–10.5)
Chloride: 102 mEq/L (ref 96–112)
Creatinine, Ser: 1.08 mg/dL (ref 0.40–1.20)
GFR: 50.47 mL/min — ABNORMAL LOW (ref >60.00)
Glucose, Bld: 94 mg/dL (ref 70–99)
Potassium: 4.5 mEq/L (ref 3.5–5.1)
Sodium: 140 mEq/L (ref 135–145)
Total Bilirubin: 0.6 mg/dL (ref 0.2–1.2)
Total Protein: 7.8 g/dL (ref 6.0–8.3)

## 2021-10-29 LAB — LIPID PANEL
Cholesterol: 197 mg/dL (ref 0–200)
HDL: 70.2 mg/dL (ref >39.00)
LDL Cholesterol: 102 mg/dL — ABNORMAL HIGH (ref 0–99)
NonHDL: 127.15
Total CHOL/HDL Ratio: 3
Triglycerides: 126 mg/dL (ref 0.0–149.0)
VLDL: 25.2 mg/dL (ref 0.0–40.0)

## 2021-10-29 MED ORDER — TRIAMCINOLONE ACETONIDE 40 MG/ML IJ SUSP
40.0000 mg | Freq: Once | INTRAMUSCULAR | Status: AC
  Administered 2021-10-29: 40 mg via INTRA_ARTICULAR

## 2021-10-29 MED ORDER — LORAZEPAM 0.5 MG PO TABS: 0.5000 mg | ORAL_TABLET | Freq: Two times a day (BID) | ORAL | 5 refills | Status: DC

## 2021-10-29 MED ORDER — AMLODIPINE BESYLATE 10 MG PO TABS: 10.0000 mg | ORAL_TABLET | Freq: Every day | ORAL | 1 refills | Status: DC

## 2021-10-29 MED ORDER — FAMOTIDINE 40 MG PO TABS
40.0000 mg | ORAL_TABLET | Freq: Every day | ORAL | 1 refills | Status: DC
Start: 2021-10-29 — End: 2022-07-01

## 2021-10-29 MED ORDER — ESCITALOPRAM OXALATE 20 MG PO TABS: 20.0000 mg | ORAL_TABLET | Freq: Every day | ORAL | 1 refills | Status: DC

## 2021-10-29 NOTE — Progress Notes (Signed)
OFFICE VISIT  10/29/2021  CC: f/u HTN, HLD, CRI Patient is a 76 y.o. female who presents for follow-up hypertension, hyperlipidemia, chronic renal insufficiency. A/P as of last visit: "1) upper airway wheezing. I think she has some residual mucus collection/inflammation around the larynx.  Reassured.  2.  Hypertension, not ideal control. Increase Toprol-XL to 1-1/2 of the 50 mg tabs daily.  Continue amlodipine 10 mg daily.  3.  Hyperlipidemia.  Tolerating atorvastatin 20 mg a day well. LDL goal around 100 or less.  LDL was 89 about 6 months ago. Lipid panel and hepatic panel today.   #4 chronic renal insufficiency stage III: Avoids NSAIDs.  Works on Financial planner well. Electrolytes and creatinine today.   #5 chronic anxiety, history of depression.  Recent adjustment disorder with mixed anxiety, depressed mood.  She is pulling through this fine at this time. Continue Lexapro 20 mg a day and lorazepam 0.5 mg twice daily as needed   6. Health maintenance exam: Reviewed age and gender appropriate health maintenance issues (prudent diet, regular exercise, health risks of tobacco and excessive alcohol, use of seatbelts, fire alarms in home, use of sunscreen).  Also reviewed age and gender appropriate health screening as well as vaccine recommendations. Vaccines:  Flu->given today.  Tdap->rx sent to pharm.  Shingrix #2-->rx sent to pharm0.  Otherwise all utd. Labs: cbc, cmet, flp. Cervical ca screening: remote hx of TAH/BSO->not a candidate for any further cerv ca screening. Breast ca screening: This has been ordered--she just needs to call to set this up [facility] imaging--breast center). Colon ca screening:  TCS this year, no adenomatous polyps: no further colon ca screening indicated. Osteoporosis screening: has been ordered."  INTERIM HX: Feeling ok. Not very active, says she wants to start losing weight. Does not adequately hydrate  Has chronic Bilat shoulder arthritis.  OTC pain  meds help most of the time. Steroid inj in the past helped.   L shoulder bothering her more lately, constant nagging pain and significant limited range of motion.   She saw an orthopedist in the past and was told she had significant arthritis in both shoulders. Most recent visit was approximately a year ago.  Said she got a steroid injection at that time and it did help for approximately 6 to 8 weeks.  PMP AWARE reviewed today: most recent rx for lorazepam 0.5 mg was filled 09/12/2021, #60, rx by me. No red flags.  Past Medical History:  Diagnosis Date   Allergic rhinitis    Anxiety and depression    Cataract    not ripe yet.   Chronic renal insufficiency, stage 3 (moderate) (HCC)    baseline GFR mid 40s as of 2020   GERD (gastroesophageal reflux disease)    + hx of reflux esophagitis   Hepatic steatosis 2012   no signif elevation of LFTs in the past. Stable on u/s 12/2018.   History of acoustic neuroma 1993   left; resected by Dr. Darrold Span at [facility]   Hyperlipidemia 2012   statin in the past, but prior pcp d/c'd it b/c pt's sister was dx'd with cirrhosis from NASH.  Pt's abd u/s showed hepatic steatosis 12/2018.  Great response to atorva started 12/2018.   Hypertension    Obstructive sleep apnea    remote past--intol of CPAP->?effort? to get assistance.  Referred for re-eval 05/2018.   Osteoarthritis, multiple sites    Osteopenia    DEXA 07/29/21 T score -2.2   Periumbilical mass 86/5784   u/s 12/2018, indeterminate->CT  abd showed this was a benign lipoma.    Past Surgical History:  Procedure Laterality Date   ACOUSTIC NEUROMA RESECTION Left 1993    [facility]). Chronic L ear hearing loss.   BREAST EXCISIONAL BIOPSY Left 1980s   COLONOSCOPY  2000; 06/18/2009; 08/2019   1 small polyp NONADENOMATOUS- 08/2019.   DEXA  07/2021   07/2021 T score -2.2. Rpt 1 yr.   TOTAL ABDOMINAL HYSTERECTOMY W/ BILATERAL SALPINGOOPHORECTOMY  REMOTE PAST   nonmalignant reason    Outpatient Medications  Prior to Visit  Medication Sig Dispense Refill   aspirin 81 MG tablet Take 81 mg by mouth daily.     atorvastatin (LIPITOR) 20 MG tablet Take 1 tablet (20 mg total) by mouth daily. 90 tablet 3   Fexofenadine HCl (ALLEGRA PO) Take by mouth.     metoprolol succinate (TOPROL-XL) 50 MG 24 hr tablet 1 and 1/2 tabs po qd 135 tablet 3   montelukast (SINGULAIR) 10 MG tablet TAKE 1 TABLET BY MOUTH AT BEDTIME 90 tablet 3   Multiple Vitamins-Minerals (EMERGEN-C FIVE PO) Take by mouth.     OVER THE COUNTER MEDICATION Take 1 capsule by mouth daily. 1 Tumeric capsule daily     VITAMIN D PO Take by mouth. Citracal     amLODipine (NORVASC) 10 MG tablet Take 1 tablet by mouth once daily 30 tablet 0   escitalopram (LEXAPRO) 20 MG tablet Take 1 tablet by mouth once daily 90 tablet 1   famotidine (PEPCID) 40 MG tablet Take 1 tablet by mouth once daily 90 tablet 3   LORazepam (ATIVAN) 0.5 MG tablet Take 1 tablet by mouth twice daily 60 tablet 0   No facility-administered medications prior to visit.    No Known Allergies  ROS As per HPI  PE:    10/29/2021    8:19 AM 04/07/2021    9:30 AM 04/07/2021    9:21 AM  Vitals with BMI  Height '5\' 3"'$   '5\' 3"'$   Weight 178 lbs  177 lbs 3 oz  BMI 36.14  43.1  Systolic 540 086 761  Diastolic 73 82 82  Pulse 71  66    Physical Exam  Gen: Alert, well appearing.  Patient is oriented to person, place, time, and situation. AFFECT: pleasant, lucid thought and speech. CV: RRR, no m/r/g.   LUNGS: CTA bilat, nonlabored resps, good aeration in all lung fields. EXT: no clubbing or cyanosis.  no edema.  Left shoulder shows abduction to about 80 degrees, about 45 degrees of internal rotation and 30 degrees of external rotation.  Significantly positive impingement signs.  With arm in 90 degrees of abduction she has markedly limited external rotation. UE strength 5/5 prox and dist bilat.   LABS:  Last CBC Lab Results  Component Value Date   WBC 6.4 03/05/2021   HGB  13.9 03/05/2021   HCT 42.8 03/05/2021   MCV 91.0 03/05/2021   RDW 14.1 03/05/2021   PLT 240.0 95/10/3265   Last metabolic panel Lab Results  Component Value Date   GLUCOSE 93 03/05/2021   NA 139 03/05/2021   K 4.2 03/05/2021   CL 103 03/05/2021   CO2 31 03/05/2021   BUN 24 (H) 03/05/2021   CREATININE 0.92 03/05/2021   CALCIUM 9.5 03/05/2021   PROT 7.2 03/05/2021   ALBUMIN 4.4 03/05/2021   BILITOT 0.6 03/05/2021   ALKPHOS 99 03/05/2021   AST 27 03/05/2021   ALT 29 03/05/2021   Last lipids Lab Results  Component Value  Date   CHOL 173 03/05/2021   HDL 69.70 03/05/2021   LDLCALC 83 03/05/2021   LDLDIRECT 174.8 01/27/2012   TRIG 101.0 03/05/2021   CHOLHDL 2 03/05/2021   Last hemoglobin A1c Lab Results  Component Value Date   HGBA1C 5.7 06/08/2016   Last thyroid functions Lab Results  Component Value Date   TSH 1.59 02/06/2020   IMPRESSION AND PLAN:  #1 hypertension, well controlled on amlodipine 10 mg a day and Toprol-XL 50 mg tabs, 1-1/2 tabs daily. Electrolytes and creatinine today.  #2 hypercholesterolemia.  Doing well on atorvastatin 20 mg a day. Lipid and hepatic panel today.  3.  Chronic renal insufficiency stage III. Avoids NSAIDs. Electrolytes and creatinine today.  4. Chronic anxiety, history of depression.  Able. Continue Lexapro 20 mg a day and lorazepam 0.5 mg twice daily as needed Lorazepam 0.5 mg, 1 twice daily, #60, refill x5.  #5 osteoarthritis, bilateral shoulders, significantly worse pain and limitation of range of motion on the left. We discussed doing a left shoulder injection today and since it had helped significantly in the right in the past he elected to proceed.  Ultrasound-guided injection is preferred based on studies that show increased duration, increased effect, greater accuracy, decreased procedural pain, increased response rate, and decreased cost with ultrasound-guided versus blind injection. Procedure: Real-time ultrasound  guided injection of glenohumeral joint left shoulder. Device: [employer] informed consent obtained.  Timeout conducted.  No overlying erythema, induration, or other signs of local infection. After sterile prep with Betadine, injected 3 cc of 1% plain lidocaine +40 mg triamcinolone using 22-gauge 2 inch needle. Patient tolerated the procedure well.  No immediate complications.  Post-injection care discussed. Advised to call if fever/chills, erythema, drainage, or persistent bleeding.  Impression: Technically successful ultrasound-guided injection.  An After Visit Summary was printed and given to the patient.  FOLLOW UP: Return in about 6 months (around 04/29/2022) for annual CPE (fasting).  Signed:  Crissie Sickles, MD           10/29/2021

## 2021-12-01 ENCOUNTER — Telehealth: Payer: Self-pay

## 2021-12-01 NOTE — Telephone Encounter (Signed)
Pt dropped off disability parking placard for completion. Placed on PCP desk to review and sign, if appropriate.

## 2021-12-01 NOTE — Telephone Encounter (Signed)
Signed and put in box to go up front. Signed:  Crissie Sickles, MD           12/01/2021

## 2022-02-15 ENCOUNTER — Other Ambulatory Visit: Payer: Self-pay | Admitting: Family Medicine

## 2022-04-29 ENCOUNTER — Encounter: Payer: PPO | Admitting: Family Medicine

## 2022-05-04 ENCOUNTER — Other Ambulatory Visit: Payer: Self-pay | Admitting: Family Medicine

## 2022-05-04 NOTE — Telephone Encounter (Signed)
Requesting: Lorazepam Contract: 02/06/20 UDS: n/a Last Visit: 10/29/21 Next Visit: 6 mo f/u cpe not scheduled Last Refill: 10/29/21 (60,5)  Please Advise. Med pending

## 2022-05-13 ENCOUNTER — Other Ambulatory Visit: Payer: Self-pay | Admitting: Family Medicine

## 2022-05-14 ENCOUNTER — Other Ambulatory Visit: Payer: Self-pay | Admitting: Family Medicine

## 2022-05-23 ENCOUNTER — Other Ambulatory Visit: Payer: Self-pay | Admitting: Family Medicine

## 2022-06-07 ENCOUNTER — Other Ambulatory Visit: Payer: Self-pay | Admitting: Family Medicine

## 2022-06-10 ENCOUNTER — Telehealth: Payer: Self-pay | Admitting: Family Medicine

## 2022-06-10 NOTE — Telephone Encounter (Signed)
Copied from CRM [id number]. Topic: Medicare AWV >> Jun 10, 2022  9:15 AM Gwenith Spitz wrote: Reason for CRM: Called patient to schedule Medicare Annual Wellness Visit (AWV). Left message for patient to call back and schedule Medicare Annual Wellness Visit (AWV).  Last date of AWV: 05/07/2021  Please schedule an appointment at any time with Please schedule an appointment  any Wednesday as Tele/Video visits with Inetta Fermo, NHA. Please schedule AWVS with Inetta Fermo, NHA [place].  Please schedule as video/tele visit only, Wednesdays only. .  If any questions, please contact me at [phone].  Thank you ,  Gabriel Cirri [facility] AWV TEAM Direct Dial [phone]

## 2022-06-24 ENCOUNTER — Other Ambulatory Visit: Payer: Self-pay | Admitting: Family Medicine

## 2022-06-25 ENCOUNTER — Telehealth: Payer: Self-pay

## 2022-06-25 NOTE — Telephone Encounter (Signed)
Called pt to schedule AWV. Please schedule with health coach or Apple Dearmas.  

## 2022-06-29 ENCOUNTER — Telehealth: Payer: Self-pay | Admitting: Family Medicine

## 2022-06-29 NOTE — Telephone Encounter (Signed)
Contacted Joan Mahoney to schedule their annual wellness visit. Appointment made for 07/01/2022.  Gabriel Cirri [facility] AWV TEAM Direct Dial [phone]

## 2022-07-01 ENCOUNTER — Other Ambulatory Visit: Payer: Self-pay | Admitting: Family Medicine

## 2022-07-01 ENCOUNTER — Ambulatory Visit (INDEPENDENT_AMBULATORY_CARE_PROVIDER_SITE_OTHER): Payer: PPO

## 2022-07-01 VITALS — Wt 178.0 lb

## 2022-07-01 DIAGNOSIS — Z0000 Encounter for general adult medical examination without abnormal findings: Secondary | ICD-10-CM

## 2022-07-01 NOTE — Patient Instructions (Signed)
Joan Mahoney , Thank you for taking time to come for your Medicare Wellness Visit. I appreciate your ongoing commitment to your health goals. Please review the following plan we discussed and let me know if I can assist you in the future.   These are the goals we discussed:  Goals      Patient Stated     Lose weight      Patient Stated     Lose weight      Patient Stated     Lose weight         This is a list of the screening recommended for you and due dates:  Health Maintenance  Topic Date Due   COVID-19 Vaccine (3 - Moderna risk series) 05/31/2019   Zoster (Shingles) Vaccine (2 of 2) 04/02/2020   Flu Shot  09/10/2022   Medicare Annual Wellness Visit  07/01/2023   Colon Cancer Screening  08/15/2029   DTaP/Tdap/Td vaccine (4 - Td or Tdap) 03/12/2031   Pneumonia Vaccine  Completed   DEXA scan (bone density measurement)  Completed   Hepatitis C Screening: USPSTF Recommendation to screen - Ages 72-79 yo.  Completed   HPV Vaccine  Aged Out    Advanced directives: Advance directive discussed with you today. Even though you declined this today please call our office should you change your mind and we can give you the proper paperwork for you to fill out.  Conditions/risks identified: lose weight   Next appointment: Follow up in one year for your annual wellness visit    Preventive Care 65 Years and Older, Female Preventive care refers to lifestyle choices and visits with your health care provider that can promote health and wellness. What does preventive care include? A yearly physical exam. This is also called an annual well check. Dental exams once or twice a year. Routine eye exams. Ask your health care provider how often you should have your eyes checked. Personal lifestyle choices, including: Daily care of your teeth and gums. Regular physical activity. Eating a healthy diet. Avoiding tobacco and drug use. Limiting alcohol use. Practicing safe sex. Taking low-dose  aspirin every day. Taking vitamin and mineral supplements as recommended by your health care provider. What happens during an annual well check? The services and screenings done by your health care provider during your annual well check will depend on your age, overall health, lifestyle risk factors, and family history of disease. Counseling  Your health care provider may ask you questions about your: Alcohol use. Tobacco use. Drug use. Emotional well-being. Home and relationship well-being. Sexual activity. Eating habits. History of falls. Memory and ability to understand (cognition). Work and work Astronomer. Reproductive health. Screening  You may have the following tests or measurements: Height, weight, and BMI. Blood pressure. Lipid and cholesterol levels. These may be checked every 5 years, or more frequently if you are over 10 years old. Skin check. Lung cancer screening. You may have this screening every year starting at age 54 if you have a 30-pack-year history of smoking and currently smoke or have quit within the past 15 years. Fecal occult blood test (FOBT) of the stool. You may have this test every year starting at age 53. Flexible sigmoidoscopy or colonoscopy. You may have a sigmoidoscopy every 5 years or a colonoscopy every 10 years starting at age 62. Hepatitis C blood test. Hepatitis B blood test. Sexually transmitted disease (STD) testing. Diabetes screening. This is done by checking your blood sugar (glucose) after you  have not eaten for a while (fasting). You may have this done every 1-3 years. Bone density scan. This is done to screen for osteoporosis. You may have this done starting at age 42. Mammogram. This may be done every 1-2 years. Talk to your health care provider about how often you should have regular mammograms. Talk with your health care provider about your test results, treatment options, and if necessary, the need for more tests. Vaccines  Your  health care provider may recommend certain vaccines, such as: Influenza vaccine. This is recommended every year. Tetanus, diphtheria, and acellular pertussis (Tdap, Td) vaccine. You may need a Td booster every 10 years. Zoster vaccine. You may need this after age 55. Pneumococcal 13-valent conjugate (PCV13) vaccine. One dose is recommended after age 52. Pneumococcal polysaccharide (PPSV23) vaccine. One dose is recommended after age 57. Talk to your health care provider about which screenings and vaccines you need and how often you need them. This information is not intended to replace advice given to you by your health care provider. Make sure you discuss any questions you have with your health care provider. Document Released: 02/22/2015 Document Revised: 10/16/2015 Document Reviewed: 11/27/2014 Elsevier Interactive Patient Education  2017 [employer].  Fall Prevention in the Home Falls can cause injuries. They can happen to people of all ages. There are many things you can do to make your home safe and to help prevent falls. What can I do on the outside of my home? Regularly fix the edges of walkways and driveways and fix any cracks. Remove anything that might make you trip as you walk through a door, such as a raised step or threshold. Trim any bushes or trees on the path to your home. Use bright outdoor lighting. Clear any walking paths of anything that might make someone trip, such as rocks or tools. Regularly check to see if handrails are loose or broken. Make sure that both sides of any steps have handrails. Any raised decks and porches should have guardrails on the edges. Have any leaves, snow, or ice cleared regularly. Use sand or salt on walking paths during winter. Clean up any spills in your garage right away. This includes oil or grease spills. What can I do in the bathroom? Use night lights. Install grab bars by the toilet and in the tub and shower. Do not use towel bars as  grab bars. Use non-skid mats or decals in the tub or shower. If you need to sit down in the shower, use a plastic, non-slip stool. Keep the floor dry. Clean up any water that spills on the floor as soon as it happens. Remove soap buildup in the tub or shower regularly. Attach bath mats securely with double-sided non-slip rug tape. Do not have throw rugs and other things on the floor that can make you trip. What can I do in the bedroom? Use night lights. Make sure that you have a light by your bed that is easy to reach. Do not use any sheets or blankets that are too big for your bed. They should not hang down onto the floor. Have a firm chair that has side arms. You can use this for support while you get dressed. Do not have throw rugs and other things on the floor that can make you trip. What can I do in the kitchen? Clean up any spills right away. Avoid walking on wet floors. Keep items that you use a lot in easy-to-reach places. If you need  to reach something above you, use a strong step stool that has a grab bar. Keep electrical cords out of the way. Do not use floor polish or wax that makes floors slippery. If you must use wax, use non-skid floor wax. Do not have throw rugs and other things on the floor that can make you trip. What can I do with my stairs? Do not leave any items on the stairs. Make sure that there are handrails on both sides of the stairs and use them. Fix handrails that are broken or loose. Make sure that handrails are as long as the stairways. Check any carpeting to make sure that it is firmly attached to the stairs. Fix any carpet that is loose or worn. Avoid having throw rugs at the top or bottom of the stairs. If you do have throw rugs, attach them to the floor with carpet tape. Make sure that you have a light switch at the top of the stairs and the bottom of the stairs. If you do not have them, ask someone to add them for you. What else can I do to help prevent  falls? Wear shoes that: Do not have high heels. Have rubber bottoms. Are comfortable and fit you well. Are closed at the toe. Do not wear sandals. If you use a stepladder: Make sure that it is fully opened. Do not climb a closed stepladder. Make sure that both sides of the stepladder are locked into place. Ask someone to hold it for you, if possible. Clearly mark and make sure that you can see: Any grab bars or handrails. First and last steps. Where the edge of each step is. Use tools that help you move around (mobility aids) if they are needed. These include: Canes. Walkers. Scooters. Crutches. Turn on the lights when you go into a dark area. Replace any light bulbs as soon as they burn out. Set up your furniture so you have a clear path. Avoid moving your furniture around. If any of your floors are uneven, fix them. If there are any pets around you, be aware of where they are. Review your medicines with your doctor. Some medicines can make you feel dizzy. This can increase your chance of falling. Ask your doctor what other things that you can do to help prevent falls. This information is not intended to replace advice given to you by your health care provider. Make sure you discuss any questions you have with your health care provider. Document Released: 11/22/2008 Document Revised: 07/04/2015 Document Reviewed: 03/02/2014 Elsevier Interactive Patient Education  2017 [employer].

## 2022-07-01 NOTE — Progress Notes (Signed)
I connected with  Joan Mahoney on 07/01/22 by a audio enabled telemedicine application and verified that I am speaking with the correct person using two identifiers.  Patient Location: Home  Provider Location: Home Office  I discussed the limitations of evaluation and management by telemedicine. The patient expressed understanding and agreed to proceed.  Patient Medicare AWV questionnaire was completed by the patient on 06/29/22; I have confirmed that all information answered by patient is correct and no changes since this date.      Subjective:   Joan Mahoney is a 77 y.o. female who presents for Medicare Annual (Subsequent) preventive examination.  Review of Systems     Cardiac Risk Factors include: advanced age (>48men, >26 women);obesity (BMI >30kg/m2);hypertension;dyslipidemia     Objective:    Today's Vitals   07/01/22 1109  Weight: 178 lb (80.7 kg)   Body mass index is 31.53 kg/m.     07/01/2022   11:15 AM 05/07/2021    3:08 PM 02/26/2021    1:17 PM  Advanced Directives  Does Patient Have a Medical Advance Directive? No No No  Would patient like information on creating a medical advance directive? No - Patient declined No - Patient declined Yes (MAU/Ambulatory/Procedural Areas - Information given)    Current Medications (verified) Outpatient Encounter Medications as of 07/01/2022  Medication Sig   amLODipine (NORVASC) 10 MG tablet Take 1 tablet (10 mg total) by mouth daily.   aspirin 81 MG tablet Take 81 mg by mouth daily.   famotidine (PEPCID) 40 MG tablet Take 40 mg by mouth daily.   Fexofenadine HCl (ALLEGRA PO) Take by mouth.   LORazepam (ATIVAN) 0.5 MG tablet Take 1 tablet by mouth twice daily   metoprolol succinate (TOPROL-XL) 50 MG 24 hr tablet TAKE 1 & 1/2 (ONE & ONE-HALF) TABLETS BY MOUTH ONCE DAILY. OFFICE VISIT NEEDED FOR FURTHER REFILLS   montelukast (SINGULAIR) 10 MG tablet TAKE 1 TABLET BY MOUTH AT BEDTIME   Multiple Vitamins-Minerals (EMERGEN-C  FIVE PO) Take by mouth.   OVER THE COUNTER MEDICATION Take 1 capsule by mouth daily. 1 Tumeric capsule daily   VITAMIN D PO Take by mouth. Citracal   atorvastatin (LIPITOR) 20 MG tablet Take 1 tablet by mouth once daily (Patient not taking: Reported on 07/01/2022)   escitalopram (LEXAPRO) 20 MG tablet Take 1 tablet (20 mg total) by mouth daily. (Patient not taking: Reported on 07/01/2022)   [DISCONTINUED] famotidine (PEPCID) 40 MG tablet Take 1 tablet (40 mg total) by mouth daily.   No facility-administered encounter medications on file as of 07/01/2022.    Allergies (verified) Patient has no known allergies.   History: Past Medical History:  Diagnosis Date   Allergic rhinitis    Anxiety and depression    Cataract    not ripe yet.   Chronic renal insufficiency, stage 3 (moderate) (HCC)    baseline GFR mid 40s as of 2020   GERD (gastroesophageal reflux disease)    + hx of reflux esophagitis   Hepatic steatosis 2012   no signif elevation of LFTs in the past. Stable on u/s 12/2018.   History of acoustic neuroma 1993   left; resected by Dr. Doreene Adas at [facility]   Hyperlipidemia 2012   statin in the past, but prior pcp d/c'd it b/c pt's sister was dx'd with cirrhosis from NASH.  Pt's abd u/s showed hepatic steatosis 12/2018.  Great response to atorva started 12/2018.   Hypertension    Obstructive sleep apnea  remote past--intol of CPAP->?effort? to get assistance.  Referred for re-eval 05/2018.   Osteoarthritis, multiple sites    Osteopenia    DEXA 07/29/21 T score -2.2   Periumbilical mass 12/2018   u/s 12/2018, indeterminate->CT abd showed this was a benign lipoma.   Past Surgical History:  Procedure Laterality Date   ACOUSTIC NEUROMA RESECTION Left 1993    [facility]). Chronic L ear hearing loss.   BREAST EXCISIONAL BIOPSY Left 1980s   COLONOSCOPY  2000; 06/18/2009; 08/2019   1 small polyp NONADENOMATOUS- 08/2019.   DEXA  07/2021   07/2021 T score -2.2. Rpt 1 yr.   TOTAL ABDOMINAL  HYSTERECTOMY W/ BILATERAL SALPINGOOPHORECTOMY  REMOTE PAST   nonmalignant reason   Family History  Problem Relation Age of Onset   Breast cancer Cousin    Diabetes type II Sister    Cirrhosis Sister        Non-alcoholic   Colon cancer Neg Hx    Colon polyps Neg Hx    Esophageal cancer Neg Hx    Rectal cancer Neg Hx    Stomach cancer Neg Hx    Social History   Socioeconomic History   Marital status: Married    Spouse name: Not on file   Number of children: Not on file   Years of education: Not on file   Highest education level: 12th grade  Occupational History   Not on file  Tobacco Use   Smoking status: Former   Smokeless tobacco: Never  Vaping Use   Vaping Use: Never used  Substance and Sexual Activity   Alcohol use: Never   Drug use: Never   Sexual activity: Not on file  Other Topics Concern   Not on file  Social History Narrative   Married, lives in [place].   I see her husband Fayrene Fearing.   No T/A/Ds.   Social Determinants of Health   Financial Resource Strain: Low Risk  (06/29/2022)   Overall Financial Resource Strain (CARDIA)    Difficulty of Paying Living Expenses: Not hard at all  Food Insecurity: No Food Insecurity (06/29/2022)   Hunger Vital Sign    Worried About Running Out of Food in the Last Year: Never true    Ran Out of Food in the Last Year: Never true  Transportation Needs: No Transportation Needs (06/29/2022)   PRAPARE - Administrator, Civil Service (Medical): No    Lack of Transportation (Non-Medical): No  Physical Activity: Inactive (06/29/2022)   Exercise Vital Sign    Days of Exercise per Week: 0 days    Minutes of Exercise per Session: 0 min  Stress: No Stress Concern Present (06/29/2022)   [employer] of Occupational Health - Occupational Stress Questionnaire    Feeling of Stress : Only a little  Social Connections: Moderately Isolated (06/29/2022)   Social Connection and Isolation Panel [NHANES]    Frequency of  Communication with Friends and Family: Twice a week    Frequency of Social Gatherings with Friends and Family: Once a week    Attends Religious Services: Never    Database administrator or Organizations: No    Attends Engineer, structural: Never    Marital Status: Married    Tobacco Counseling Counseling given: Not Answered   Clinical Intake:  Pre-visit preparation completed: Yes  Pain : No/denies pain     BMI - recorded: 31.53 Nutritional Status: BMI > 30  Obese Nutritional Risks: None Diabetes: No  How often do you  need to have someone help you when you read instructions, pamphlets, or other written materials from your doctor or pharmacy?: 1 - Never  Diabetic?no  Interpreter Needed?: No  Information entered by :: Lanier Ensign, LPN   Activities of Daily Living    06/29/2022    2:38 PM  In your present state of health, do you have any difficulty performing the following activities:  Hearing? 1  Comment deaf in left ear  Vision? 0  Difficulty concentrating or making decisions? 1  Walking or climbing stairs? 0  Dressing or bathing? 0  Doing errands, shopping? 0  Preparing Food and eating ? N  Using the Toilet? N  In the past six months, have you accidently leaked urine? Y  Comment wearsa panty liner  Do you have problems with loss of bowel control? Y  Managing your Medications? N  Managing your Finances? N  Housekeeping or managing your Housekeeping? N    Patient Care Team: Jeoffrey Massed, MD as PCP - General (Family Medicine) Armbruster, Willaim Rayas, MD as Consulting Physician (Gastroenterology) Sinda Du, MD as Consulting Physician (Ophthalmology) Bjorn Pippin, MD as Consulting Physician (Orthopedic Surgery)  Indicate any recent Medical Services you may have received from other than [facility] providers in the past year (date may be approximate).     Assessment:   This is a routine wellness examination for Williams.  Hearing/Vision  screen Hearing Screening - Comments:: Pt stated deaf in left ear  Vision Screening - Comments:: Pt follows up with Dr Nelle Don / Dr Cathey Endow for annual eye exams   Dietary issues and exercise activities discussed: Current Exercise Habits: The patient does not participate in regular exercise at present   Goals Addressed             This Visit's Progress    Patient Stated       Lose weight        Depression Screen    07/01/2022   11:14 AM 10/29/2021    8:26 AM 05/07/2021    3:07 PM 02/26/2021    1:16 PM 09/02/2020    8:49 AM 06/06/2019    9:15 AM 12/07/2018   10:22 AM  PHQ 2/9 Scores  PHQ - 2 Score 1 1 0 1 0 0 1  PHQ- 9 Score      0 3    Fall Risk    07/01/2022   11:16 AM 06/29/2022    2:38 PM 10/29/2021    8:26 AM 05/07/2021    3:10 PM 04/03/2021    9:36 AM  Fall Risk   Falls in the past year? 1 1 1  0 0  Number falls in past yr: 0 0 1 0   Injury with Fall? 0 0 0 0   Risk for fall due to : Impaired vision;Impaired balance/gait  No Fall Risks Impaired vision;Impaired balance/gait   Follow up Falls prevention discussed  Falls evaluation completed Falls prevention discussed     FALL RISK PREVENTION PERTAINING TO THE HOME:  Any stairs in or around the home? Yes  If so, are there any without handrails? No  Home free of loose throw rugs in walkways, pet beds, electrical cords, etc? Yes  Adequate lighting in your home to reduce risk of falls? Yes   ASSISTIVE DEVICES UTILIZED TO PREVENT FALLS:  Life alert? No  Use of a cane, walker or w/c? No  Grab bars in the bathroom? Yes  Shower chair or bench in shower? Yes  Elevated toilet  seat or a handicapped toilet? No   TIMED UP AND GO:  Was the test performed? No .   Cognitive Function:        07/01/2022   11:18 AM 05/07/2021    3:11 PM 02/26/2021    1:22 PM  6CIT Screen  What Year? 0 points 0 points 0 points  What month? 0 points 0 points 0 points  What time? 0 points 0 points 0 points  Count back from 20 0 points 0  points 0 points  Months in reverse 0 points 0 points 0 points  Repeat phrase 0 points 0 points 2 points  Total Score 0 points 0 points 2 points    Immunizations Immunization History  Administered Date(s) Administered   Fluad Quad(high Dose 65+) 10/12/2018, 02/06/2020, 03/05/2021, 10/29/2021   Influenza Split 12/10/2012   Influenza Whole 02/10/2004, 12/11/2006, 12/09/2007   Influenza, High Dose Seasonal PF 12/09/2015, 12/08/2016   Influenza-Unspecified 11/22/2017   Moderna Sars-Covid-2 Vaccination 04/05/2019, 05/03/2019   Pneumococcal Conjugate-13 04/26/2013   Pneumococcal Polysaccharide-23 02/15/2012   Td 02/10/2000   Tdap 09/22/2010, 03/11/2021   Zoster Recombinat (Shingrix) 02/06/2020   Zoster, Live 07/13/2014    TDAP status: Up to date  Flu Vaccine status: Up to date  Pneumococcal vaccine status: Up to date  Covid-19 vaccine status: Completed vaccines  Qualifies for Shingles Vaccine? Yes   Zostavax completed No   Shingrix Completed?: No.    Education has been provided regarding the importance of this vaccine. Patient has been advised to call insurance company to determine out of pocket expense if they have not yet received this vaccine. Advised may also receive vaccine at local pharmacy or Health Dept. Verbalized acceptance and understanding.  Screening Tests Health Maintenance  Topic Date Due   COVID-19 Vaccine (3 - Moderna risk series) 05/31/2019   Zoster Vaccines- Shingrix (2 of 2) 04/02/2020   INFLUENZA VACCINE  09/10/2022   Medicare Annual Wellness (AWV)  07/01/2023   COLONOSCOPY (Pts 45-71yrs Insurance coverage will need to be confirmed)  08/15/2029   DTaP/Tdap/Td (4 - Td or Tdap) 03/12/2031   Pneumonia Vaccine 53+ Years old  Completed   DEXA SCAN  Completed   Hepatitis C Screening  Completed   HPV VACCINES  Aged Out    Health Maintenance  Health Maintenance Due  Topic Date Due   COVID-19 Vaccine (3 - Moderna risk series) 05/31/2019   Zoster Vaccines-  Shingrix (2 of 2) 04/02/2020    Colorectal cancer screening: Type of screening: Colonoscopy. Completed 08/16/19. Repeat every 10 years  Mammogram status: Completed 04/01/21. Repeat every year  Bone Density status: Completed 07/29/21. Results reflect: Bone density results: OSTEOPENIA. Repeat every 2 years.   Additional Screening:  Hepatitis C Screening: Completed 04/05/09  Vision Screening: Recommended annual ophthalmology exams for early detection of glaucoma and other disorders of the eye. Is the patient up to date with their annual eye exam?  Yes  Who is the provider or what is the name of the office in which the patient attends annual eye exams? Dr Cathey Endow  If pt is not established with a provider, would they like to be referred to a provider to establish care? No .   Dental Screening: Recommended annual dental exams for proper oral hygiene  Community Resource Referral / Chronic Care Management: CRR required this visit?  No   CCM required this visit?  No      Plan:     I have personally reviewed and noted the following in  the patient's chart:   Medical and social history Use of alcohol, tobacco or illicit drugs  Current medications and supplements including opioid prescriptions. Patient is not currently taking opioid prescriptions. Functional ability and status Nutritional status Physical activity Advanced directives List of other physicians Hospitalizations, surgeries, and ER visits in previous 12 months Vitals Screenings to include cognitive, depression, and falls Referrals and appointments  In addition, I have reviewed and discussed with patient certain preventive protocols, quality metrics, and best practice recommendations. A written personalized care plan for preventive services as well as general preventive health recommendations were provided to patient.     Marzella Schlein, LPN   05/18/8117   Nurse Notes: none

## 2022-07-04 ENCOUNTER — Other Ambulatory Visit: Payer: Self-pay | Admitting: Family Medicine

## 2022-07-07 ENCOUNTER — Other Ambulatory Visit: Payer: Self-pay

## 2022-07-07 ENCOUNTER — Telehealth: Payer: Self-pay | Admitting: Family Medicine

## 2022-07-07 MED ORDER — MONTELUKAST SODIUM 10 MG PO TABS: 10.0000 mg | ORAL_TABLET | Freq: Every day | ORAL | 0 refills | Status: DC

## 2022-07-07 MED ORDER — METOPROLOL SUCCINATE ER 50 MG PO TB24: ORAL_TABLET | ORAL | 0 refills | Status: DC

## 2022-07-07 MED ORDER — AMLODIPINE BESYLATE 10 MG PO TABS: 10.0000 mg | ORAL_TABLET | Freq: Every day | ORAL | 0 refills | Status: DC

## 2022-07-07 NOTE — Telephone Encounter (Signed)
Refill requested for Ativan. Next OV is 6/11

## 2022-07-07 NOTE — Telephone Encounter (Signed)
Patient is pretty much out of all medications. She reports that the pharmacy kept denying it but she did not know why and is upset that it has been this long. The patient is scheduled for June 11th however she will need a temp fill on all medications.

## 2022-07-08 MED ORDER — LORAZEPAM 0.5 MG PO TABS: 0.5000 mg | ORAL_TABLET | Freq: Two times a day (BID) | ORAL | 0 refills | Status: DC

## 2022-07-08 NOTE — Telephone Encounter (Signed)
OK, lorazepam eRx'd. 

## 2022-07-08 NOTE — Telephone Encounter (Signed)
noted 

## 2022-07-10 ENCOUNTER — Other Ambulatory Visit: Payer: Self-pay | Admitting: Family Medicine

## 2022-07-21 ENCOUNTER — Ambulatory Visit (INDEPENDENT_AMBULATORY_CARE_PROVIDER_SITE_OTHER): Payer: PPO | Admitting: Family Medicine

## 2022-07-21 ENCOUNTER — Encounter: Payer: Self-pay | Admitting: Family Medicine

## 2022-07-21 VITALS — BP 119/79 | HR 70 | Wt 182.4 lb

## 2022-07-21 DIAGNOSIS — Z0000 Encounter for general adult medical examination without abnormal findings: Secondary | ICD-10-CM

## 2022-07-21 DIAGNOSIS — E7800 Pure hypercholesterolemia, unspecified: Secondary | ICD-10-CM

## 2022-07-21 DIAGNOSIS — N1831 Chronic kidney disease, stage 3a: Secondary | ICD-10-CM | POA: Diagnosis not present

## 2022-07-21 DIAGNOSIS — I10 Essential (primary) hypertension: Secondary | ICD-10-CM

## 2022-07-21 DIAGNOSIS — Z79899 Other long term (current) drug therapy: Secondary | ICD-10-CM | POA: Diagnosis not present

## 2022-07-21 LAB — LIPID PANEL
Cholesterol: 171 mg/dL (ref 0–200)
HDL: 53 mg/dL (ref >39.00)
LDL Cholesterol: 96 mg/dL (ref 0–99)
NonHDL: 117.7
Total CHOL/HDL Ratio: 3
Triglycerides: 110 mg/dL (ref 0.0–149.0)
VLDL: 22 mg/dL (ref 0.0–40.0)

## 2022-07-21 LAB — COMPREHENSIVE METABOLIC PANEL
ALT: 39 U/L — ABNORMAL HIGH (ref 0–35)
AST: 31 U/L (ref 0–37)
Albumin: 4 g/dL (ref 3.5–5.2)
Alkaline Phosphatase: 102 U/L (ref 39–117)
BUN: 22 mg/dL (ref 6–23)
CO2: 27 mEq/L (ref 19–32)
Calcium: 9.4 mg/dL (ref 8.4–10.5)
Chloride: 105 mEq/L (ref 96–112)
Creatinine, Ser: 0.89 mg/dL (ref 0.40–1.20)
GFR: 63.34 mL/min (ref >60.00)
Glucose, Bld: 92 mg/dL (ref 70–99)
Potassium: 3.9 mEq/L (ref 3.5–5.1)
Sodium: 140 mEq/L (ref 135–145)
Total Bilirubin: 0.5 mg/dL (ref 0.2–1.2)
Total Protein: 7 g/dL (ref 6.0–8.3)

## 2022-07-21 LAB — CBC
HCT: 42.3 % (ref 36.0–46.0)
Hemoglobin: 13.8 g/dL (ref 12.0–15.0)
MCHC: 32.7 g/dL (ref 30.0–36.0)
MCV: 92.3 fl (ref 78.0–100.0)
Platelets: 276 10*3/uL (ref 150.0–400.0)
RBC: 4.58 Mil/uL (ref 3.87–5.11)
RDW: 13.8 % (ref 11.5–15.5)
WBC: 7.3 10*3/uL (ref 4.0–10.5)

## 2022-07-21 MED ORDER — MONTELUKAST SODIUM 10 MG PO TABS: 10.0000 mg | ORAL_TABLET | Freq: Every day | ORAL | 1 refills | Status: DC

## 2022-07-21 MED ORDER — LORAZEPAM 0.5 MG PO TABS: 0.5000 mg | ORAL_TABLET | Freq: Two times a day (BID) | ORAL | 2 refills | Status: DC

## 2022-07-21 MED ORDER — ATORVASTATIN CALCIUM 20 MG PO TABS: 20.0000 mg | ORAL_TABLET | Freq: Every day | ORAL | 1 refills | Status: DC

## 2022-07-21 MED ORDER — METOPROLOL SUCCINATE ER 50 MG PO TB24: ORAL_TABLET | ORAL | 1 refills | Status: DC

## 2022-07-21 MED ORDER — AMLODIPINE BESYLATE 10 MG PO TABS: 10.0000 mg | ORAL_TABLET | Freq: Every day | ORAL | 1 refills | Status: DC

## 2022-07-21 MED ORDER — ESCITALOPRAM OXALATE 20 MG PO TABS: 20.0000 mg | ORAL_TABLET | Freq: Every day | ORAL | 1 refills | Status: DC

## 2022-07-21 MED ORDER — FAMOTIDINE 40 MG PO TABS: 40.0000 mg | ORAL_TABLET | Freq: Every day | ORAL | 1 refills | Status: DC

## 2022-07-21 NOTE — Patient Instructions (Signed)

## 2022-07-21 NOTE — Progress Notes (Signed)
Office Note 07/21/2022  CC:  Chief Complaint  Patient presents with   Follow-up    9 month follow up.   Patient is a 77 y.o. female who is here for annual health maintenance exam and follow-up hypertension, hyperlipidemia, and chronic renal insufficiency stage IIIa. A/P as of last visit: "#1 hypertension, well controlled on amlodipine 10 mg a day and Toprol-XL 50 mg tabs, 1-1/2 tabs daily. Electrolytes and creatinine today.   #2 hypercholesterolemia.  Doing well on atorvastatin 20 mg a day. Lipid and hepatic panel today.   3.  Chronic renal insufficiency stage III. Avoids NSAIDs. Electrolytes and creatinine today.   4. Chronic anxiety, history of depression.  Stable Continue Lexapro 20 mg a day and lorazepam 0.5 mg twice daily as needed Lorazepam 0.5 mg, 1 twice daily, #60, refill x5.   #5 osteoarthritis, bilateral shoulders, significantly worse pain and limitation of range of motion on the left. We discussed doing a left shoulder GH injection today and since it had helped significantly in the right in the past he elected to proceed."  INTERIM HX: Joan Mahoney is feeling well. No acute concerns.  Home blood pressure monitoring consistently less than 130/80. Of note her right shoulder injection last visit did help a significant amount.  She still deals with the pain at mild to moderate level with good days and bad days.  She avoids use of NSAIDs for pain.  PMP AWARE reviewed today: most recent rx for lorazepam was filled 07/08/2022, # 60, rx by me. No red flags.  Past Medical History:  Diagnosis Date   Allergic rhinitis    Anxiety and depression    Cataract    not ripe yet.   Chronic renal insufficiency, stage 3 (moderate) (HCC)    baseline GFR mid 40s as of 2020   GERD (gastroesophageal reflux disease)    + hx of reflux esophagitis   Hepatic steatosis 2012   no signif elevation of LFTs in the past. Stable on u/s 12/2018.   History of acoustic neuroma 1993   left;  resected by Dr. Doreene Adas at [facility]   Hyperlipidemia 2012   statin in the past, but prior pcp d/c'd it b/c pt's sister was dx'd with cirrhosis from NASH.  Pt's abd u/s showed hepatic steatosis 12/2018.  Great response to atorva started 12/2018.   Hypertension    Obstructive sleep apnea    remote past--intol of CPAP->?effort? to get assistance.  Referred for re-eval 05/2018.   Osteoarthritis, multiple sites    Osteopenia    DEXA 07/29/21 T score -2.2   Periumbilical mass 12/2018   u/s 12/2018, indeterminate->CT abd showed this was a benign lipoma.    Past Surgical History:  Procedure Laterality Date   ACOUSTIC NEUROMA RESECTION Left 1993    [facility]). Chronic L ear hearing loss.   BREAST EXCISIONAL BIOPSY Left 1980s   COLONOSCOPY  2000; 06/18/2009; 08/2019   1 small polyp NONADENOMATOUS- 08/2019.   DEXA  07/2021   07/2021 T score -2.2. Rpt 1 yr.   TOTAL ABDOMINAL HYSTERECTOMY W/ BILATERAL SALPINGOOPHORECTOMY  REMOTE PAST   nonmalignant reason    Family History  Problem Relation Age of Onset   Breast cancer Cousin    Diabetes type II Sister    Cirrhosis Sister        Non-alcoholic   Colon cancer Neg Hx    Colon polyps Neg Hx    Esophageal cancer Neg Hx    Rectal cancer Neg Hx    Stomach cancer  Neg Hx     Social History   Socioeconomic History   Marital status: Married    Spouse name: Not on file   Number of children: Not on file   Years of education: Not on file   Highest education level: 12th grade  Occupational History   Not on file  Tobacco Use   Smoking status: Former   Smokeless tobacco: Never  Vaping Use   Vaping Use: Never used  Substance and Sexual Activity   Alcohol use: Never   Drug use: Never   Sexual activity: Not on file  Other Topics Concern   Not on file  Social History Narrative   Married, lives in [place].   I see her husband Fayrene Fearing.   No T/A/Ds.   Social Determinants of Health   Financial Resource Strain: Low Risk  (07/20/2022)   Overall  Financial Resource Strain (CARDIA)    Difficulty of Paying Living Expenses: Not hard at all  Food Insecurity: No Food Insecurity (07/20/2022)   Hunger Vital Sign    Worried About Running Out of Food in the Last Year: Never true    Ran Out of Food in the Last Year: Never true  Transportation Needs: No Transportation Needs (07/20/2022)   PRAPARE - Administrator, Civil Service (Medical): No    Lack of Transportation (Non-Medical): No  Physical Activity: Insufficiently Active (07/20/2022)   Exercise Vital Sign    Days of Exercise per Week: 2 days    Minutes of Exercise per Session: 10 min  Stress: Stress Concern Present (07/20/2022)   [employer] of Occupational Health - Occupational Stress Questionnaire    Feeling of Stress : To some extent  Social Connections: Moderately Integrated (07/20/2022)   Social Connection and Isolation Panel [NHANES]    Frequency of Communication with Friends and Family: Three times a week    Frequency of Social Gatherings with Friends and Family: Once a week    Attends Religious Services: 1 to 4 times per year    Active Member of [employer] or Organizations: No    Attends Banker Meetings: Never    Marital Status: Married  Recent Concern: Social Connections - Moderately Isolated (06/29/2022)   Social Connection and Isolation Panel [NHANES]    Frequency of Communication with Friends and Family: Twice a week    Frequency of Social Gatherings with Friends and Family: Once a week    Attends Religious Services: Never    Database administrator or Organizations: No    Attends Banker Meetings: Never    Marital Status: Married  Catering manager Violence: Not At Risk (07/01/2022)   Humiliation, Afraid, Rape, and Kick questionnaire    Fear of Current or Ex-Partner: No    Emotionally Abused: No    Physically Abused: No    Sexually Abused: No    Outpatient Medications Prior to Visit  Medication Sig Dispense Refill   aspirin 81 MG  tablet Take 81 mg by mouth daily.     Fexofenadine HCl (ALLEGRA PO) Take by mouth.     Multiple Vitamins-Minerals (EMERGEN-C FIVE PO) Take by mouth.     OVER THE COUNTER MEDICATION Take 1 capsule by mouth daily. 1 Tumeric capsule daily     VITAMIN D PO Take by mouth. Citracal     amLODipine (NORVASC) 10 MG tablet Take 1 tablet (10 mg total) by mouth daily. 30 tablet 0   atorvastatin (LIPITOR) 20 MG tablet Take 1 tablet by mouth  once daily 30 tablet 0   escitalopram (LEXAPRO) 20 MG tablet Take 1 tablet by mouth once daily 30 tablet 0   famotidine (PEPCID) 40 MG tablet Take 1 tablet by mouth once daily 30 tablet 0   LORazepam (ATIVAN) 0.5 MG tablet Take 1 tablet (0.5 mg total) by mouth 2 (two) times daily. 60 tablet 0   metoprolol succinate (TOPROL-XL) 50 MG 24 hr tablet TAKE 1 & 1/2 (ONE & ONE-HALF) TABLETS BY MOUTH ONCE DAILY. OFFICE VISIT NEEDED FOR FURTHER REFILLS 45 tablet 0   montelukast (SINGULAIR) 10 MG tablet Take 1 tablet (10 mg total) by mouth at bedtime. 30 tablet 0   No facility-administered medications prior to visit.    No Known Allergies  Review of Systems  Constitutional:  Negative for appetite change, chills, fatigue and fever.  HENT:  Negative for congestion, dental problem, ear pain and sore throat.   Eyes:  Negative for discharge, redness and visual disturbance.  Respiratory:  Negative for cough, chest tightness, shortness of breath and wheezing.   Cardiovascular:  Negative for chest pain, palpitations and leg swelling.  Gastrointestinal:  Negative for abdominal pain, blood in stool, diarrhea, nausea and vomiting.  Genitourinary:  Negative for difficulty urinating, dysuria, flank pain, frequency, hematuria and urgency.  Musculoskeletal:  Negative for arthralgias, back pain, joint swelling, myalgias and neck stiffness.  Skin:  Negative for pallor and rash.  Neurological:  Negative for dizziness, speech difficulty, weakness and headaches.  Hematological:  Negative for  adenopathy. Does not bruise/bleed easily.  Psychiatric/Behavioral:  Negative for confusion and sleep disturbance. The patient is not nervous/anxious.    PE;    07/21/2022   10:41 AM 07/01/2022   11:09 AM 10/29/2021    8:19 AM  Vitals with BMI  Height   5\' 3"   Weight 182 lbs 6 oz 178 lbs 178 lbs  BMI   31.54  Systolic 119  117  Diastolic 79  73  Pulse 70  71   Exam chaperoned by Cloe Motsinger, CMA Gen: Alert, well appearing.  Patient is oriented to person, place, time, and situation. AFFECT: pleasant, lucid thought and speech. ENT: Ears: EACs clear, normal epithelium.  TMs with good light reflex and landmarks bilaterally.  Eyes: no injection, icteris, swelling, or exudate.  EOMI, PERRLA. Nose: no drainage or turbinate edema/swelling.  No injection or focal lesion.  Mouth: lips without lesion/swelling.  Oral mucosa pink and moist.  Dentition intact and without obvious caries or gingival swelling.  Oropharynx without erythema, exudate, or swelling.  Neck: supple/nontender.  No LAD, mass, or TM.  Carotid pulses 2+ bilaterally, without bruits. CV: RRR, no m/r/g.   LUNGS: CTA bilat, nonlabored resps, good aeration in all lung fields. ABD: soft, NT, ND, BS normal.  No hepatospenomegaly or mass.  No bruits. EXT: no clubbing, cyanosis, or edema.  Musculoskeletal: no joint swelling, erythema, warmth, or tenderness.  ROM of all joints intact. Skin - no sores or suspicious lesions or rashes or color changes  Pertinent labs:  Lab Results  Component Value Date   TSH 1.59 02/06/2020   Lab Results  Component Value Date   WBC 6.4 03/05/2021   HGB 13.9 03/05/2021   HCT 42.8 03/05/2021   MCV 91.0 03/05/2021   PLT 240.0 03/05/2021   Lab Results  Component Value Date   CREATININE 1.08 10/29/2021   BUN 24 (H) 10/29/2021   NA 140 10/29/2021   K 4.5 10/29/2021   CL 102 10/29/2021   CO2 30 10/29/2021  Lab Results  Component Value Date   ALT 33 10/29/2021   AST 28 10/29/2021   ALKPHOS  113 10/29/2021   BILITOT 0.6 10/29/2021   Lab Results  Component Value Date   CHOL 197 10/29/2021   Lab Results  Component Value Date   HDL 70.20 10/29/2021   Lab Results  Component Value Date   LDLCALC 102 (H) 10/29/2021   Lab Results  Component Value Date   TRIG 126.0 10/29/2021   Lab Results  Component Value Date   CHOLHDL 3 10/29/2021   Lab Results  Component Value Date   HGBA1C 5.7 06/08/2016   ASSESSMENT AND PLAN:   #1 health maintenance exam: Reviewed age and gender appropriate health maintenance issues (prudent diet, regular exercise, health risks of tobacco and excessive alcohol, use of seatbelts, fire alarms in home, use of sunscreen).  Also reviewed age and gender appropriate health screening as well as vaccine recommendations. Vaccines:  ALL UTD Labs: cbc, cmet, flp. Cervical ca screening: remote hx of TAH/BSO->not a candidate for any further cerv ca screening. Breast ca screening: Patient due for annual mammogram--she will call to arrange. Colon ca screening:  TCS 2023, no adenomatous polyps: no further colon ca screening indicated. Osteoporosis screening: (osteopenia 2023).  Rpt DEXA 2025  2 hypertension, well controlled on amlodipine 10 mg a day and Toprol-XL 50 mg tabs, 1-1/2 tabs daily. Electrolytes and creatinine today.   3 hypercholesterolemia.  Doing well on atorvastatin 20 mg a day. Lipid and hepatic panel today.   4  Chronic renal insufficiency stage III. Avoids NSAIDs. Electrolytes and creatinine today.   5 Chronic anxiety, history of depression.  Stable Continue Lexapro 20 mg a day and lorazepam 0.5 mg twice daily as needed Lorazepam 0.5 mg, 1 twice daily, #60, refill x2.  An After Visit Summary was printed and given to the patient.  FOLLOW UP:  Return in about 6 months (around 01/20/2023) for routine chronic illness f/u.  Signed:  Santiago Bumpers, MD           07/21/2022

## 2022-10-08 DIAGNOSIS — B078 Other viral warts: Secondary | ICD-10-CM | POA: Diagnosis not present

## 2022-10-08 DIAGNOSIS — C44311 Basal cell carcinoma of skin of nose: Secondary | ICD-10-CM | POA: Diagnosis not present

## 2022-11-19 DIAGNOSIS — X32XXXD Exposure to sunlight, subsequent encounter: Secondary | ICD-10-CM | POA: Diagnosis not present

## 2022-11-19 DIAGNOSIS — L570 Actinic keratosis: Secondary | ICD-10-CM | POA: Diagnosis not present

## 2022-11-19 DIAGNOSIS — Z08 Encounter for follow-up examination after completed treatment for malignant neoplasm: Secondary | ICD-10-CM | POA: Diagnosis not present

## 2022-11-19 DIAGNOSIS — Z85828 Personal history of other malignant neoplasm of skin: Secondary | ICD-10-CM | POA: Diagnosis not present

## 2023-01-20 ENCOUNTER — Ambulatory Visit: Payer: PPO | Admitting: Family Medicine

## 2023-01-27 ENCOUNTER — Other Ambulatory Visit: Payer: Self-pay | Admitting: Family Medicine

## 2023-01-31 ENCOUNTER — Other Ambulatory Visit: Payer: Self-pay | Admitting: Family Medicine

## 2023-02-11 ENCOUNTER — Ambulatory Visit: Payer: PPO | Admitting: Family Medicine

## 2023-03-16 ENCOUNTER — Other Ambulatory Visit: Payer: Self-pay | Admitting: Family Medicine

## 2023-04-28 ENCOUNTER — Other Ambulatory Visit: Payer: Self-pay | Admitting: Family Medicine

## 2023-05-05 ENCOUNTER — Other Ambulatory Visit: Payer: Self-pay | Admitting: Family Medicine

## 2023-05-05 NOTE — Telephone Encounter (Signed)
 Refill requested for Lorazepam sent to [facility] on [address]. Next OV 4/8

## 2023-05-14 NOTE — Patient Instructions (Addendum)
 Start pantoprazole every morning. Take a pepcid every evening for 3 days then ok to stop it.      It was very nice to see you today!   PLEASE NOTE:  If labs were collected or images ordered, we will inform you of  results once we have received them and reviewed. We will contact you either by echart message, or telephone call.     If we ordered any referrals today, please let us know if you have not heard from their office within the next 2 weeks. You should receive a letter via MyChart confirming if the referral was approved and their office contact information to schedule.

## 2023-05-18 ENCOUNTER — Ambulatory Visit (INDEPENDENT_AMBULATORY_CARE_PROVIDER_SITE_OTHER): Admitting: Family Medicine

## 2023-05-18 ENCOUNTER — Encounter: Payer: Self-pay | Admitting: Family Medicine

## 2023-05-18 VITALS — BP 120/76 | HR 68 | Ht 63.0 in | Wt 182.0 lb

## 2023-05-18 DIAGNOSIS — N1831 Chronic kidney disease, stage 3a: Secondary | ICD-10-CM | POA: Diagnosis not present

## 2023-05-18 DIAGNOSIS — N2889 Other specified disorders of kidney and ureter: Secondary | ICD-10-CM

## 2023-05-18 DIAGNOSIS — R21 Rash and other nonspecific skin eruption: Secondary | ICD-10-CM | POA: Diagnosis not present

## 2023-05-18 DIAGNOSIS — E7800 Pure hypercholesterolemia, unspecified: Secondary | ICD-10-CM | POA: Diagnosis not present

## 2023-05-18 DIAGNOSIS — I10 Essential (primary) hypertension: Secondary | ICD-10-CM | POA: Diagnosis not present

## 2023-05-18 DIAGNOSIS — K219 Gastro-esophageal reflux disease without esophagitis: Secondary | ICD-10-CM | POA: Diagnosis not present

## 2023-05-18 DIAGNOSIS — Z79899 Other long term (current) drug therapy: Secondary | ICD-10-CM

## 2023-05-18 DIAGNOSIS — F411 Generalized anxiety disorder: Secondary | ICD-10-CM | POA: Diagnosis not present

## 2023-05-18 LAB — COMPREHENSIVE METABOLIC PANEL WITH GFR
ALT: 49 U/L — ABNORMAL HIGH (ref 0–35)
AST: 39 U/L — ABNORMAL HIGH (ref 0–37)
Albumin: 4.6 g/dL (ref 3.5–5.2)
Alkaline Phosphatase: 121 U/L — ABNORMAL HIGH (ref 39–117)
BUN: 22 mg/dL (ref 6–23)
CO2: 27 meq/L (ref 19–32)
Calcium: 9.7 mg/dL (ref 8.4–10.5)
Chloride: 102 meq/L (ref 96–112)
Creatinine, Ser: 0.94 mg/dL (ref 0.40–1.20)
GFR: 58.98 mL/min — ABNORMAL LOW (ref >60.00)
Glucose, Bld: 97 mg/dL (ref 70–99)
Potassium: 4.7 meq/L (ref 3.5–5.1)
Sodium: 137 meq/L (ref 135–145)
Total Bilirubin: 0.6 mg/dL (ref 0.2–1.2)
Total Protein: 7.8 g/dL (ref 6.0–8.3)

## 2023-05-18 LAB — LIPID PANEL
Cholesterol: 215 mg/dL — ABNORMAL HIGH (ref 0–200)
HDL: 59.6 mg/dL (ref >39.00)
LDL Cholesterol: 133 mg/dL — ABNORMAL HIGH (ref 0–99)
NonHDL: 155.81
Total CHOL/HDL Ratio: 4
Triglycerides: 114 mg/dL (ref 0.0–149.0)
VLDL: 22.8 mg/dL (ref 0.0–40.0)

## 2023-05-18 MED ORDER — FAMOTIDINE 40 MG PO TABS: 40.0000 mg | ORAL_TABLET | Freq: Every day | ORAL | 1 refills | Status: DC

## 2023-05-18 MED ORDER — AMLODIPINE BESYLATE 10 MG PO TABS
10.0000 mg | ORAL_TABLET | Freq: Every day | ORAL | 1 refills | Status: DC
Start: 2023-05-18 — End: 2023-11-12

## 2023-05-18 MED ORDER — METOPROLOL SUCCINATE ER 50 MG PO TB24: ORAL_TABLET | ORAL | 1 refills | Status: AC

## 2023-05-18 MED ORDER — ATORVASTATIN CALCIUM 20 MG PO TABS: 20.0000 mg | ORAL_TABLET | Freq: Every day | ORAL | 1 refills | Status: DC

## 2023-05-18 MED ORDER — ESCITALOPRAM OXALATE 20 MG PO TABS: 20.0000 mg | ORAL_TABLET | Freq: Every day | ORAL | 1 refills | Status: DC

## 2023-05-18 MED ORDER — PANTOPRAZOLE SODIUM 40 MG PO TBEC: 40.0000 mg | DELAYED_RELEASE_TABLET | Freq: Every day | ORAL | 1 refills | Status: DC

## 2023-05-18 NOTE — Progress Notes (Signed)
 OFFICE VISIT  05/18/2023  CC:  Chief Complaint  Patient presents with   Medical Management of Chronic Issues    Pt is fasting    Patient is a 78 y.o. female who presents for 9-month follow-up hypertension, hyperlipidemia, anxiety and depression, and chronic renal insufficiency stage IIIa.  INTERIM HX: She has had worsened heartburn lately.  She admits that she eats basically what she wants.  She notes that tomatoes do make it worse but there are other foods that make it worse but she cannot think what particular ones they are at this time. She takes Pepcid  but not regularly.  Over the last few months she has noted pink spots on the skin of her arms and legs, mostly extensor surfaces.  It does itch.  No hives, no blisters, no pustules.  She does not recall any preceding viral illness.  No new medications.  No contact irritants/allergens.   She gets good response for her anxiety when she takes daily Lexapro  20 mg a day and lorazepam  1-2 times a day.  ROS as above, plus--> no fevers, no CP, no SOB, no wheezing, no cough, no dizziness, no HAs,no melena/hematochezia.  No polyuria or polydipsia.  No myalgias or arthralgias.  No focal weakness, paresthesias, or tremors.  No acute vision or hearing abnormalities.  No dysuria or unusual/new urinary urgency or frequency.  No recent changes in lower legs. No n/v/d or abd pain.  No palpitations.    PMP AWARE reviewed today: most recent rx for lorazepam  0.5 mg was filled 05/06/2023, # 60, rx by me. No red flags.  Past Medical History:  Diagnosis Date   Allergic rhinitis    Anxiety and depression    Cataract    not ripe yet.   Chronic renal insufficiency, stage 3 (moderate) (HCC)    baseline GFR mid 40s as of 2020   GERD (gastroesophageal reflux disease)    + hx of reflux esophagitis   Hepatic steatosis 2012   no signif elevation of LFTs in the past. Stable on u/s 12/2018.   History of acoustic neuroma 1993   left; resected by Dr. McEleveen  at [facility]   Hyperlipidemia 2012   statin in the past, but prior pcp d/c'd it b/c pt's sister was dx'd with cirrhosis from NASH.  Pt's abd u/s showed hepatic steatosis 12/2018.  Great response to atorva started 12/2018.   Hypertension    Obstructive sleep apnea    remote past--intol of CPAP->?effort? to get assistance.  Referred for re-eval 05/2018.   Osteoarthritis, multiple sites    Osteopenia    DEXA 07/29/21 T score -2.2   Periumbilical mass 12/2018   u/s 12/2018, indeterminate->CT abd showed this was a benign lipoma.    Past Surgical History:  Procedure Laterality Date   ACOUSTIC NEUROMA RESECTION Left 1993    [facility]). Chronic L ear hearing loss.   BREAST EXCISIONAL BIOPSY Left 1980s   COLONOSCOPY  2000; 06/18/2009; 08/2019   1 small polyp NONADENOMATOUS- 08/2019.   DEXA  07/2021   07/2021 T score -2.2. Rpt 1 yr.   TOTAL ABDOMINAL HYSTERECTOMY W/ BILATERAL SALPINGOOPHORECTOMY  REMOTE PAST   nonmalignant reason    Outpatient Medications Prior to Visit  Medication Sig Dispense Refill   amLODipine  (NORVASC ) 10 MG tablet Take 1 tablet by mouth once daily 90 tablet 0   aspirin 81 MG tablet Take 81 mg by mouth daily.     atorvastatin  (LIPITOR) 20 MG tablet Take 1 tablet by mouth once daily  90 tablet 0   escitalopram  (LEXAPRO ) 20 MG tablet Take 1 tablet by mouth once daily 90 tablet 0   famotidine  (PEPCID ) 40 MG tablet Take 1 tablet (40 mg total) by mouth daily. 90 tablet 1   Fexofenadine HCl (ALLEGRA PO) Take by mouth.     LORazepam  (ATIVAN ) 0.5 MG tablet Take 1 tablet by mouth twice daily 60 tablet 2   metoprolol  succinate (TOPROL -XL) 50 MG 24 hr tablet TAKE 1 & 1/2 (ONE & ONE-HALF) TABLETS BY MOUTH ONCE DAILY. 135 tablet 1   montelukast  (SINGULAIR ) 10 MG tablet TAKE 1 TABLET BY MOUTH AT BEDTIME 30 tablet 5   Multiple Vitamins-Minerals (EMERGEN-C FIVE PO) Take by mouth.     OVER THE COUNTER MEDICATION Take 1 capsule by mouth daily. 1 Tumeric capsule daily     VITAMIN D PO Take by mouth.  Citracal     No facility-administered medications prior to visit.    No Known Allergies  Review of Systems As per HPI  PE:    05/18/2023    9:26 AM 07/21/2022   10:41 AM 07/01/2022   11:09 AM  Vitals with BMI  Height 5\' 3"     Weight 182 lbs 182 lbs 6 oz 178 lbs  BMI 32.25    Systolic 120 119   Diastolic 76 79   Pulse 68 70      Physical Exam  Gen: Alert, well appearing.  Patient is oriented to person, place, time, and situation. AFFECT: pleasant, lucid thought and speech. Skin: She has scattered oval flat papules on the extensor surfaces of both her arms and both legs.  The size range is 3 mm to 6 mm diameter.  Some are raised more than others. There is a few lesions on the flexor surfaces.  Nothing on the trunk or face or scalp. No excoriations, vesicles, pustules, or hives.  LABS:  Last CBC Lab Results  Component Value Date   WBC 7.3 07/21/2022   HGB 13.8 07/21/2022   HCT 42.3 07/21/2022   MCV 92.3 07/21/2022   RDW 13.8 07/21/2022   PLT 276.0 07/21/2022   Last metabolic panel Lab Results  Component Value Date   GLUCOSE 92 07/21/2022   NA 140 07/21/2022   K 3.9 07/21/2022   CL 105 07/21/2022   CO2 27 07/21/2022   BUN 22 07/21/2022   CREATININE 0.89 07/21/2022   GFR 63.34 07/21/2022   CALCIUM  9.4 07/21/2022   PROT 7.0 07/21/2022   ALBUMIN 4.0 07/21/2022   BILITOT 0.5 07/21/2022   ALKPHOS 102 07/21/2022   AST 31 07/21/2022   ALT 39 (H) 07/21/2022   Last lipids Lab Results  Component Value Date   CHOL 171 07/21/2022   HDL 53.00 07/21/2022   LDLCALC 96 07/21/2022   LDLDIRECT 174.8 01/27/2012   TRIG 110.0 07/21/2022   CHOLHDL 3 07/21/2022   Last hemoglobin A1c Lab Results  Component Value Date   HGBA1C 5.7 06/08/2016   Last thyroid  functions Lab Results  Component Value Date   TSH 1.59 02/06/2020   IMPRESSION AND PLAN:  #1 hypertension, well-controlled on Toprol -XL 75 mg a day and amlodipine  10 mg a day. Electrolytes and creatinine  today.  2.  Hypercholesterolemia, doing well on a atorvastatin  20 mg a day. Lipid panel and hepatic panel today.  3.  Chronic renal insufficiency stage IIIa. Avoids NSAIDs.  Hydrates well. Monitor electrolytes and creatinine today.  4.  GAD. Well-controlled on Lexapro  20 mg a day and lorazepam  0.5 mg twice a  day.  5.  Rash, unknown etiology. She will be seeing her dermatologist.  #6 GERD, not ideal control. Having breakthrough symptoms.  Start pantoprazole  40 mg a day. Continue Pepcid  40 mg nightly for 3 days and then stop this medication.  An After Visit Summary was printed and given to the patient.  FOLLOW UP: 4 weeks Next CPE June 2025 Signed:  Arletha Lady, MD           05/18/2023

## 2023-05-23 ENCOUNTER — Other Ambulatory Visit: Payer: Self-pay | Admitting: Family Medicine

## 2023-05-23 ENCOUNTER — Encounter: Payer: Self-pay | Admitting: Family Medicine

## 2023-05-23 DIAGNOSIS — E7800 Pure hypercholesterolemia, unspecified: Secondary | ICD-10-CM

## 2023-05-23 DIAGNOSIS — R7401 Elevation of levels of liver transaminase levels: Secondary | ICD-10-CM

## 2023-06-14 NOTE — Patient Instructions (Signed)
 Joan Mahoney

## 2023-06-15 ENCOUNTER — Ambulatory Visit (INDEPENDENT_AMBULATORY_CARE_PROVIDER_SITE_OTHER): Admitting: Family Medicine

## 2023-06-15 ENCOUNTER — Encounter: Payer: Self-pay | Admitting: Family Medicine

## 2023-06-15 VITALS — BP 131/80 | HR 53 | Wt 184.0 lb

## 2023-06-15 DIAGNOSIS — K219 Gastro-esophageal reflux disease without esophagitis: Secondary | ICD-10-CM

## 2023-06-15 DIAGNOSIS — R7401 Elevation of levels of liver transaminase levels: Secondary | ICD-10-CM

## 2023-06-15 DIAGNOSIS — K760 Fatty (change of) liver, not elsewhere classified: Secondary | ICD-10-CM | POA: Diagnosis not present

## 2023-06-15 MED ORDER — PANTOPRAZOLE SODIUM 40 MG PO TBEC: 40.0000 mg | DELAYED_RELEASE_TABLET | Freq: Every day | ORAL | 3 refills | Status: AC

## 2023-06-15 NOTE — Progress Notes (Signed)
 OFFICE VISIT  06/15/2023  CC:  Chief Complaint  Patient presents with   Gastroesophageal Reflux    F/U.     Patient is a 78 y.o. female who presents for 3-week follow-up GERD and elevated LFTs. A/P as of last visit: "#1 hypertension, well-controlled on Toprol -XL 75 mg a day and amlodipine  10 mg a day. Electrolytes and creatinine today.   2.  Hypercholesterolemia, doing well on a atorvastatin  20 mg a day. Lipid panel and hepatic panel today.   3.  Chronic renal insufficiency stage IIIa. Avoids NSAIDs.  Hydrates well. Monitor electrolytes and creatinine today.   4.  GAD. Well-controlled on Lexapro  20 mg a day and lorazepam  0.5 mg twice a day.   5.  Rash, unknown etiology. She will be seeing her dermatologist.   #6 GERD, not ideal control. Having breakthrough symptoms.  Start pantoprazole  40 mg a day. Continue Pepcid  40 mg nightly for 3 days and then stop this medication."  INTERIM HX: Transaminases elevated at most recent labs, similar to past measurements and consistent with her known diagnosis of hepatic steatosis.  Pantoprazole  working very well for her reflux.  Past Medical History:  Diagnosis Date   Allergic rhinitis    Anxiety and depression    Cataract    not ripe yet.   Chronic renal insufficiency, stage 3 (moderate) (HCC)    baseline GFR mid 40s as of 2020   GERD (gastroesophageal reflux disease)    + hx of reflux esophagitis   Hepatic steatosis 2012   no signif elevation of LFTs in the past. Stable on u/s 12/2018.   History of acoustic neuroma 1993   left; resected by Dr. McEleveen at [facility]   Hyperlipidemia 2012   statin in the past, but prior pcp d/c'd it b/c pt's sister was dx'd with cirrhosis from NASH.  Pt's abd u/s showed hepatic steatosis 12/2018.  Great response to atorva started 12/2018.   Hypertension    Obstructive sleep apnea    remote past--intol of CPAP->?effort? to get assistance.  Referred for re-eval 05/2018.   Osteoarthritis, multiple  sites    Osteopenia    DEXA 07/29/21 T score -2.2   Periumbilical mass 12/2018   u/s 12/2018, indeterminate->CT abd showed this was a benign lipoma.    Past Surgical History:  Procedure Laterality Date   ACOUSTIC NEUROMA RESECTION Left 1993    [facility]). Chronic L ear hearing loss.   BREAST EXCISIONAL BIOPSY Left 1980s   COLONOSCOPY  2000; 06/18/2009; 08/2019   1 small polyp NONADENOMATOUS- 08/2019.   DEXA  07/2021   07/2021 T score -2.2. Rpt 1 yr.   TOTAL ABDOMINAL HYSTERECTOMY W/ BILATERAL SALPINGOOPHORECTOMY  REMOTE PAST   nonmalignant reason    Outpatient Medications Prior to Visit  Medication Sig Dispense Refill   amLODipine  (NORVASC ) 10 MG tablet Take 1 tablet (10 mg total) by mouth daily. 90 tablet 1   aspirin 81 MG tablet Take 81 mg by mouth daily.     atorvastatin  (LIPITOR) 20 MG tablet Take 1 tablet (20 mg total) by mouth daily. 90 tablet 1   escitalopram  (LEXAPRO ) 20 MG tablet Take 1 tablet (20 mg total) by mouth daily. 90 tablet 1   famotidine  (PEPCID ) 40 MG tablet Take 1 tablet (40 mg total) by mouth daily. 90 tablet 1   Fexofenadine HCl (ALLEGRA PO) Take by mouth.     LORazepam  (ATIVAN ) 0.5 MG tablet Take 1 tablet by mouth twice daily 60 tablet 2   metoprolol  succinate (TOPROL -XL)  50 MG 24 hr tablet TAKE 1 & 1/2 (ONE & ONE-HALF) TABLETS BY MOUTH ONCE DAILY. 135 tablet 1   montelukast  (SINGULAIR ) 10 MG tablet TAKE 1 TABLET BY MOUTH AT BEDTIME 30 tablet 5   Multiple Vitamins-Minerals (EMERGEN-C FIVE PO) Take by mouth.     OVER THE COUNTER MEDICATION Take 1 capsule by mouth daily. 1 Tumeric capsule daily     VITAMIN D PO Take by mouth. Citracal     pantoprazole  (PROTONIX ) 40 MG tablet Take 1 tablet (40 mg total) by mouth daily. 30 tablet 1   No facility-administered medications prior to visit.    No Known Allergies  Review of Systems As per HPI  PE:    06/15/2023    7:59 AM 05/18/2023    9:26 AM 07/21/2022   10:41 AM  Vitals with BMI  Height  5\' 3"    Weight 184 lbs  182 lbs 182 lbs 6 oz  BMI 32.6 32.25   Systolic 131 120 161  Diastolic 80 76 79  Pulse 53 68 70     Physical Exam  Gen: Alert, well appearing.  Patient is oriented to person, place, time, and situation. AFFECT: pleasant, lucid thought and speech. No further exam today  LABS:  Last CBC Lab Results  Component Value Date   WBC 7.3 07/21/2022   HGB 13.8 07/21/2022   HCT 42.3 07/21/2022   MCV 92.3 07/21/2022   RDW 13.8 07/21/2022   PLT 276.0 07/21/2022   Last metabolic panel Lab Results  Component Value Date   GLUCOSE 97 05/18/2023   NA 137 05/18/2023   K 4.7 05/18/2023   CL 102 05/18/2023   CO2 27 05/18/2023   BUN 22 05/18/2023   CREATININE 0.94 05/18/2023   GFR 58.98 (L) 05/18/2023   CALCIUM  9.7 05/18/2023   PROT 7.8 05/18/2023   ALBUMIN 4.6 05/18/2023   BILITOT 0.6 05/18/2023   ALKPHOS 121 (H) 05/18/2023   AST 39 (H) 05/18/2023   ALT 49 (H) 05/18/2023   Last lipids Lab Results  Component Value Date   CHOL 215 (H) 05/18/2023   HDL 59.60 05/18/2023   LDLCALC 133 (H) 05/18/2023   LDLDIRECT 174.8 01/27/2012   TRIG 114.0 05/18/2023   CHOLHDL 4 05/18/2023   Last hemoglobin A1c Lab Results  Component Value Date   HGBA1C 5.7 06/08/2016   Last thyroid  functions Lab Results  Component Value Date   TSH 1.59 02/06/2020   IMPRESSION AND PLAN:  #1 GERD, great response to pantoprazole  40 mg daily. Discontinue Pepcid .  2.  Elevated transaminases, mild and chronic. Imaging in the past has confirmed hepatic steatosis. We have plans to repeat these at the end of the month and I will check hepatitis serology at that time.   We are okay to continue with her atorvastatin  at this point. He does not drink alcohol. I recommend that she avoid the use of Tylenol or NSAIDs with any regularity.  An After Visit Summary was printed and given to the patient.  FOLLOW UP: Return in about 3 months (around 09/15/2023) for annual CPE (fasting). Next CPE after June 2025 Signed:   Arletha Lady, MD           06/15/2023

## 2023-07-07 ENCOUNTER — Ambulatory Visit (INDEPENDENT_AMBULATORY_CARE_PROVIDER_SITE_OTHER): Payer: PPO | Admitting: *Deleted

## 2023-07-07 DIAGNOSIS — Z0000 Encounter for general adult medical examination without abnormal findings: Secondary | ICD-10-CM | POA: Diagnosis not present

## 2023-07-07 NOTE — Progress Notes (Signed)
 Subjective:   Joan Mahoney is a 78 y.o. female who presents for Medicare Annual (Subsequent) preventive examination.  Visit Complete: Virtual I connected with  Mckaela Howley Winbush on 07/07/23 by a audio enabled telemedicine application and verified that I am speaking with the correct person using two identifiers.  Patient Location: Home  Provider Location: Home Office  I discussed the limitations of evaluation and management by telemedicine. The patient expressed understanding and agreed to proceed.  Vital Signs: Because this visit was a virtual/telehealth visit, some criteria may be missing or patient reported. Any vitals not documented were not able to be obtained and vitals that have been documented are patient reported.  Cardiac Risk Factors include: advanced age (>54men, >76 women);hypertension     Objective:     There were no vitals filed for this visit. There is no height or weight on file to calculate BMI.     07/07/2023   10:54 AM 07/01/2022   11:15 AM 05/07/2021    3:08 PM 02/26/2021    1:17 PM  Advanced Directives  Does Patient Have a Medical Advance Directive? No No No No  Would patient like information on creating a medical advance directive? No - Patient declined No - Patient declined No - Patient declined Yes (MAU/Ambulatory/Procedural Areas - Information given)    Current Medications (verified) Outpatient Encounter Medications as of 07/07/2023  Medication Sig   amLODipine  (NORVASC ) 10 MG tablet Take 1 tablet (10 mg total) by mouth daily.   aspirin 81 MG tablet Take 81 mg by mouth daily.   atorvastatin  (LIPITOR) 20 MG tablet Take 1 tablet (20 mg total) by mouth daily.   escitalopram  (LEXAPRO ) 20 MG tablet Take 1 tablet (20 mg total) by mouth daily.   famotidine  (PEPCID ) 40 MG tablet Take 1 tablet (40 mg total) by mouth daily.   Fexofenadine HCl (ALLEGRA PO) Take by mouth.   LORazepam  (ATIVAN ) 0.5 MG tablet Take 1 tablet by mouth twice daily   metoprolol   succinate (TOPROL -XL) 50 MG 24 hr tablet TAKE 1 & 1/2 (ONE & ONE-HALF) TABLETS BY MOUTH ONCE DAILY.   montelukast  (SINGULAIR ) 10 MG tablet TAKE 1 TABLET BY MOUTH AT BEDTIME   Multiple Vitamins-Minerals (EMERGEN-C FIVE PO) Take by mouth.   OVER THE COUNTER MEDICATION Take 1 capsule by mouth daily. 1 Tumeric capsule daily   pantoprazole  (PROTONIX ) 40 MG tablet Take 1 tablet (40 mg total) by mouth daily.   VITAMIN D PO Take by mouth. Citracal   No facility-administered encounter medications on file as of 07/07/2023.    Allergies (verified) Patient has no known allergies.   History: Past Medical History:  Diagnosis Date   Allergic rhinitis    Anxiety and depression    Cataract    not ripe yet.   Chronic renal insufficiency, stage 3 (moderate) (HCC)    baseline GFR mid 40s as of 2020   GERD (gastroesophageal reflux disease)    + hx of reflux esophagitis   Hepatic steatosis 2012   no signif elevation of LFTs in the past. Stable on u/s 12/2018.   History of acoustic neuroma 1993   left; resected by Dr. [facility]Eleveen at [facility]   Hyperlipidemia 2012   statin in the past, but prior pcp d/c'd it b/c pt's sister was dx'd with cirrhosis from NASH.  Pt's abd u/s showed hepatic steatosis 12/2018.  Great response to atorva started 12/2018.   Hypertension    Obstructive sleep apnea    remote past--intol of CPAP->?effort?  to get assistance.  Referred for re-eval 05/2018.   Osteoarthritis, multiple sites    Osteopenia    DEXA 07/29/21 T score -2.2   Periumbilical mass 12/2018   u/s 12/2018, indeterminate->CT abd showed this was a benign lipoma.   Past Surgical History:  Procedure Laterality Date   ACOUSTIC NEUROMA RESECTION Left 1993    [facility]). Chronic L ear hearing loss.   BREAST EXCISIONAL BIOPSY Left 1980s   COLONOSCOPY  2000; 06/18/2009; 08/2019   1 small polyp NONADENOMATOUS- 08/2019.   DEXA  07/2021   07/2021 T score -2.2. Rpt 1 yr.   TOTAL ABDOMINAL HYSTERECTOMY W/ BILATERAL  SALPINGOOPHORECTOMY  REMOTE PAST   nonmalignant reason   Family History  Problem Relation Age of Onset   Breast cancer Cousin    Diabetes type II Sister    Cirrhosis Sister        Non-alcoholic   Colon cancer Neg Hx    Colon polyps Neg Hx    Esophageal cancer Neg Hx    Rectal cancer Neg Hx    Stomach cancer Neg Hx    Social History   Socioeconomic History   Marital status: Married    Spouse name: Not on file   Number of children: Not on file   Years of education: Not on file   Highest education level: 12th grade  Occupational History   Not on file  Tobacco Use   Smoking status: Former   Smokeless tobacco: Never  Vaping Use   Vaping status: Never Used  Substance and Sexual Activity   Alcohol use: Never   Drug use: Never   Sexual activity: Not on file  Other Topics Concern   Not on file  Social History Narrative   Married, lives in [place].   I see her husband Royston Cornea.   No T/A/Ds.   Social Drivers of Corporate investment banker Strain: Low Risk  (07/07/2023)   Overall Financial Resource Strain (CARDIA)    Difficulty of Paying Living Expenses: Not hard at all  Food Insecurity: No Food Insecurity (07/07/2023)   Hunger Vital Sign    Worried About Running Out of Food in the Last Year: Never true    Ran Out of Food in the Last Year: Never true  Transportation Needs: No Transportation Needs (07/07/2023)   PRAPARE - Administrator, Civil Service (Medical): No    Lack of Transportation (Non-Medical): No  Physical Activity: Inactive (07/07/2023)   Exercise Vital Sign    Days of Exercise per Week: 0 days    Minutes of Exercise per Session: 0 min  Stress: No Stress Concern Present (07/07/2023)   [employer] of Occupational Health - Occupational Stress Questionnaire    Feeling of Stress : Only a little  Social Connections: Moderately Isolated (07/07/2023)   Social Connection and Isolation Panel [NHANES]    Frequency of Communication with Friends and  Family: More than three times a week    Frequency of Social Gatherings with Friends and Family: Once a week    Attends Religious Services: Never    Database administrator or Organizations: No    Attends Engineer, structural: Never    Marital Status: Married    Tobacco Counseling Counseling given: Not Answered   Clinical Intake:  Pre-visit preparation completed: Yes  Pain : No/denies pain     Diabetes: No  How often do you need to have someone help you when you read instructions, pamphlets, or other written materials  from your doctor or pharmacy?: 1 - Never  Interpreter Needed?: No  Information entered by :: Kieth Pelt LPN   Activities of Daily Living    07/07/2023   10:53 AM  In your present state of health, do you have any difficulty performing the following activities:  Hearing? 0  Vision? 0  Difficulty concentrating or making decisions? 0  Walking or climbing stairs? 0  Dressing or bathing? 0  Doing errands, shopping? 0  Preparing Food and eating ? N  Using the Toilet? N  In the past six months, have you accidently leaked urine? N  Do you have problems with loss of bowel control? N  Managing your Medications? N  Managing your Finances? N  Housekeeping or managing your Housekeeping? N    Patient Care Team: Shelvia Dick, MD as PCP - General (Family Medicine) Armbruster, Lendon Queen, MD as Consulting Physician (Gastroenterology) Cindra Cree, MD as Consulting Physician (Ophthalmology) Micheline Ahr, MD as Consulting Physician (Orthopedic Surgery)  Indicate any recent Medical Services you may have received from other than [facility] providers in the past year (date may be approximate).     Assessment:    This is a routine wellness examination for [place].  Hearing/Vision screen Hearing Screening - Comments:: No trouble hearing Vision Screening - Comments:: Up to date Unsure of name   Goals Addressed             This Visit's Progress     Patient Stated   Not on track    Lose weight      Patient Stated   Not on track    Lose weight      Patient Stated   Not on track    Lose weight      Patient Stated       Travel        Depression Screen    07/07/2023   10:57 AM 05/18/2023    9:28 AM 07/21/2022   10:45 AM 07/01/2022   11:14 AM 10/29/2021    8:26 AM 05/07/2021    3:07 PM 02/26/2021    1:16 PM  PHQ 2/9 Scores  PHQ - 2 Score 1 2 2 1 1  0 1  PHQ- 9 Score 2 5 7         Fall Risk    07/07/2023   11:11 AM 05/18/2023    9:27 AM 07/21/2022   10:45 AM 07/01/2022   11:16 AM 06/29/2022    2:38 PM  Fall Risk   Falls in the past year? 1 1 1 1 1   Number falls in past yr: 0 0 0 0 0  Injury with Fall? 0 0 0 0 0  Risk for fall due to :  No Fall Risks History of fall(s) Impaired vision;Impaired balance/gait   Follow up Falls evaluation completed;Education provided;Falls prevention discussed Falls evaluation completed Falls evaluation completed Falls prevention discussed     MEDICARE RISK AT HOME: Medicare Risk at Home Any stairs in or around the home?: Yes If so, are there any without handrails?: No Home free of loose throw rugs in walkways, pet beds, electrical cords, etc?: Yes Adequate lighting in your home to reduce risk of falls?: Yes Life alert?: No Use of a cane, walker or w/c?: No Grab bars in the bathroom?: Yes Shower chair or bench in shower?: Yes Elevated toilet seat or a handicapped toilet?: No  TIMED UP AND GO:  Was the test performed?  No    Cognitive Function:  07/07/2023   10:54 AM 07/01/2022   11:18 AM 05/07/2021    3:11 PM 02/26/2021    1:22 PM  6CIT Screen  What Year? 0 points 0 points 0 points 0 points  What month? 0 points 0 points 0 points 0 points  What time? 0 points 0 points 0 points 0 points  Count back from 20 0 points 0 points 0 points 0 points  Months in reverse 0 points 0 points 0 points 0 points  Repeat phrase 10 points 0 points 0 points 2 points  Total Score 10 points 0  points 0 points 2 points    Immunizations Immunization History  Administered Date(s) Administered   Fluad Quad(high Dose 65+) 10/12/2018, 02/06/2020, 03/05/2021, 10/29/2021   Influenza Split 12/10/2012   Influenza Whole 02/10/2004, 12/11/2006, 12/09/2007   Influenza, High Dose Seasonal PF 12/09/2015, 12/08/2016   Influenza-Unspecified 11/22/2017   Moderna Sars-Covid-2 Vaccination 04/05/2019, 05/03/2019   Pneumococcal Conjugate-13 04/26/2013   Pneumococcal Polysaccharide-23 02/15/2012   Td 02/10/2000   Tdap 09/22/2010, 03/11/2021   Zoster Recombinant(Shingrix) 02/06/2020, 07/13/2022   Zoster, Live 07/13/2014    TDAP status: Up to date  Flu Vaccine status: Up to date  Pneumococcal vaccine status: Up to date  Covid-19 vaccine status: Information provided on how to obtain vaccines.   Qualifies for Shingles Vaccine? No   Zostavax completed Yes   Shingrix Completed?: Yes  Screening Tests Health Maintenance  Topic Date Due   COVID-19 Vaccine (3 - Moderna risk series) 07/23/2023 (Originally 05/31/2019)   INFLUENZA VACCINE  09/10/2023   Medicare Annual Wellness (AWV)  07/06/2024   DTaP/Tdap/Td (4 - Td or Tdap) 03/12/2031   Pneumonia Vaccine 6+ Years old  Completed   DEXA SCAN  Completed   Hepatitis C Screening  Completed   Zoster Vaccines- Shingrix  Completed   HPV VACCINES  Aged Out   Meningococcal B Vaccine  Aged Out   Colonoscopy  Discontinued    Health Maintenance  There are no preventive care reminders to display for this patient.   Colorectal cancer screening: No longer required.   Mammogram   patient will schedule  Bone Density status: Completed 2023. Results reflect: Bone density results: NORMAL. Repeat every 0 years.  Lung Cancer Screening: (Low Dose CT Chest recommended if Age 31-80 years, 20 pack-year currently smoking OR have quit w/in 15years.) does not qualify.   Lung Cancer Screening Referral:   Additional Screening:  Hepatitis C Screening: does  not qualify; Completed 2011  Vision Screening: Recommended annual ophthalmology exams for early detection of glaucoma and other disorders of the eye. Is the patient up to date with their annual eye exam?  Yes  Who is the provider or what is the name of the office in which the patient attends annual eye exams? Unsure of name If pt is not established with a provider, would they like to be referred to a provider to establish care? No .   Dental Screening: Recommended annual dental exams for proper oral hygiene    Community Resource Referral / Chronic Care Management: CRR required this visit?  No   CCM required this visit?  No     Plan:     I have personally reviewed and noted the following in the patient's chart:   Medical and social history Use of alcohol, tobacco or illicit drugs  Current medications and supplements including opioid prescriptions. Patient is not currently taking opioid prescriptions. Functional ability and status Nutritional status Physical activity Advanced directives List of other  physicians Hospitalizations, surgeries, and ER visits in previous 12 months Vitals Screenings to include cognitive, depression, and falls Referrals and appointments  In addition, I have reviewed and discussed with patient certain preventive protocols, quality metrics, and best practice recommendations. A written personalized care plan for preventive services as well as general preventive health recommendations were provided to patient.     Kieth Pelt, LPN   [id number]/62/[id number]   After Visit Summary: (MyChart) Due to this being a telephonic visit, the after visit summary with patients personalized plan was offered to patient via MyChart   Nurse Notes:

## 2023-07-07 NOTE — Patient Instructions (Signed)
 Joan Mahoney , Thank you for taking time to come for your Medicare Wellness Visit. I appreciate your ongoing commitment to your health goals. Please review the following plan we discussed and let me know if I can assist you in the future.   Screening recommendations/referrals: Colonoscopy: no longer required Mammogram: Education provided Bone Density: up to date Recommended yearly ophthalmology/optometry visit for glaucoma screening and checkup Recommended yearly dental visit for hygiene and checkup  Vaccinations: Influenza vaccine: up to date Pneumococcal vaccine: up to date Tdap vaccine: up to date Shingles vaccine: up to date      Preventive Care 65 Years and Older, Female Preventive care refers to lifestyle choices and visits with your health care provider that can promote health and wellness. What does preventive care include? A yearly physical exam. This is also called an annual well check. Dental exams once or twice a year. Routine eye exams. Ask your health care provider how often you should have your eyes checked. Personal lifestyle choices, including: Daily care of your teeth and gums. Regular physical activity. Eating a healthy diet. Avoiding tobacco and drug use. Limiting alcohol use. Practicing safe sex. Taking low-dose aspirin every day. Taking vitamin and mineral supplements as recommended by your health care provider. What happens during an annual well check? The services and screenings done by your health care provider during your annual well check will depend on your age, overall health, lifestyle risk factors, and family history of disease. Counseling  Your health care provider may ask you questions about your: Alcohol use. Tobacco use. Drug use. Emotional well-being. Home and relationship well-being. Sexual activity. Eating habits. History of falls. Memory and ability to understand (cognition). Work and work Astronomer. Reproductive health. Screening   You may have the following tests or measurements: Height, weight, and BMI. Blood pressure. Lipid and cholesterol levels. These may be checked every 5 years, or more frequently if you are over 16 years old. Skin check. Lung cancer screening. You may have this screening every year starting at age 25 if you have a 30-pack-year history of smoking and currently smoke or have quit within the past 15 years. Fecal occult blood test (FOBT) of the stool. You may have this test every year starting at age 74. Flexible sigmoidoscopy or colonoscopy. You may have a sigmoidoscopy every 5 years or a colonoscopy every 10 years starting at age 79. Hepatitis C blood test. Hepatitis B blood test. Sexually transmitted disease (STD) testing. Diabetes screening. This is done by checking your blood sugar (glucose) after you have not eaten for a while (fasting). You may have this done every 1-3 years. Bone density scan. This is done to screen for osteoporosis. You may have this done starting at age 49. Mammogram. This may be done every 1-2 years. Talk to your health care provider about how often you should have regular mammograms. Talk with your health care provider about your test results, treatment options, and if necessary, the need for more tests. Vaccines  Your health care provider may recommend certain vaccines, such as: Influenza vaccine. This is recommended every year. Tetanus, diphtheria, and acellular pertussis (Tdap, Td) vaccine. You may need a Td booster every 10 years. Zoster vaccine. You may need this after age 38. Pneumococcal 13-valent conjugate (PCV13) vaccine. One dose is recommended after age 69. Pneumococcal polysaccharide (PPSV23) vaccine. One dose is recommended after age 52. Talk to your health care provider about which screenings and vaccines you need and how often you need them. This information  is not intended to replace advice given to you by your health care provider. Make sure you discuss  any questions you have with your health care provider. Document Released: 02/22/2015 Document Revised: 10/16/2015 Document Reviewed: 11/27/2014 Elsevier Interactive Patient Education  2017 [employer].  Fall Prevention in the Home Falls can cause injuries. They can happen to people of all ages. There are many things you can do to make your home safe and to help prevent falls. What can I do on the outside of my home? Regularly fix the edges of walkways and driveways and fix any cracks. Remove anything that might make you trip as you walk through a door, such as a raised step or threshold. Trim any bushes or trees on the path to your home. Use bright outdoor lighting. Clear any walking paths of anything that might make someone trip, such as rocks or tools. Regularly check to see if handrails are loose or broken. Make sure that both sides of any steps have handrails. Any raised decks and porches should have guardrails on the edges. Have any leaves, snow, or ice cleared regularly. Use sand or salt on walking paths during winter. Clean up any spills in your garage right away. This includes oil or grease spills. What can I do in the bathroom? Use night lights. Install grab bars by the toilet and in the tub and shower. Do not use towel bars as grab bars. Use non-skid mats or decals in the tub or shower. If you need to sit down in the shower, use a plastic, non-slip stool. Keep the floor dry. Clean up any water that spills on the floor as soon as it happens. Remove soap buildup in the tub or shower regularly. Attach bath mats securely with double-sided non-slip rug tape. Do not have throw rugs and other things on the floor that can make you trip. What can I do in the bedroom? Use night lights. Make sure that you have a light by your bed that is easy to reach. Do not use any sheets or blankets that are too big for your bed. They should not hang down onto the floor. Have a firm chair that has  side arms. You can use this for support while you get dressed. Do not have throw rugs and other things on the floor that can make you trip. What can I do in the kitchen? Clean up any spills right away. Avoid walking on wet floors. Keep items that you use a lot in easy-to-reach places. If you need to reach something above you, use a strong step stool that has a grab bar. Keep electrical cords out of the way. Do not use floor polish or wax that makes floors slippery. If you must use wax, use non-skid floor wax. Do not have throw rugs and other things on the floor that can make you trip. What can I do with my stairs? Do not leave any items on the stairs. Make sure that there are handrails on both sides of the stairs and use them. Fix handrails that are broken or loose. Make sure that handrails are as long as the stairways. Check any carpeting to make sure that it is firmly attached to the stairs. Fix any carpet that is loose or worn. Avoid having throw rugs at the top or bottom of the stairs. If you do have throw rugs, attach them to the floor with carpet tape. Make sure that you have a light switch at the top of  the stairs and the bottom of the stairs. If you do not have them, ask someone to add them for you. What else can I do to help prevent falls? Wear shoes that: Do not have high heels. Have rubber bottoms. Are comfortable and fit you well. Are closed at the toe. Do not wear sandals. If you use a stepladder: Make sure that it is fully opened. Do not climb a closed stepladder. Make sure that both sides of the stepladder are locked into place. Ask someone to hold it for you, if possible. Clearly mark and make sure that you can see: Any grab bars or handrails. First and last steps. Where the edge of each step is. Use tools that help you move around (mobility aids) if they are needed. These include: Canes. Walkers. Scooters. Crutches. Turn on the lights when you go into a dark area.  Replace any light bulbs as soon as they burn out. Set up your furniture so you have a clear path. Avoid moving your furniture around. If any of your floors are uneven, fix them. If there are any pets around you, be aware of where they are. Review your medicines with your doctor. Some medicines can make you feel dizzy. This can increase your chance of falling. Ask your doctor what other things that you can do to help prevent falls. This information is not intended to replace advice given to you by your health care provider. Make sure you discuss any questions you have with your health care provider. Document Released: 11/22/2008 Document Revised: 07/04/2015 Document Reviewed: 03/02/2014 Elsevier Interactive Patient Education  2017 [employer].

## 2023-10-15 ENCOUNTER — Other Ambulatory Visit: Payer: Self-pay | Admitting: Family Medicine

## 2023-10-27 ENCOUNTER — Other Ambulatory Visit: Payer: Self-pay | Admitting: Family Medicine

## 2023-11-12 ENCOUNTER — Other Ambulatory Visit: Payer: Self-pay | Admitting: Family Medicine

## 2024-02-04 ENCOUNTER — Telehealth: Payer: Self-pay | Admitting: Pharmacist

## 2024-02-04 NOTE — Progress Notes (Signed)
 This patient is appearing on a report for being at risk of failing the adherence measure for cholesterol (statin) medications this calendar year.   Medication: atorvastatin  20 mg Last fill date: 11/12/2023 for 30 day supply  Left voicemail for patient to return my call at their convenience.  Sharyle Sia, PharmD, [facility] [phone]

## 2024-07-12 ENCOUNTER — Encounter
